# Patient Record
Sex: Male | Born: 1970 | Race: White | Hispanic: No | Marital: Married | Smoking: Former smoker
Health system: Southern US, Community
[De-identification: ages and names within clinical notes are randomized; demographics above are authoritative.]

## PROBLEM LIST (undated history)

## (undated) DIAGNOSIS — I4892 Unspecified atrial flutter: Secondary | ICD-10-CM

## (undated) HISTORY — DX: Unspecified atrial flutter: I48.92

## (undated) HISTORY — PX: ANTERIOR CRUCIATE LIGAMENT REPAIR: SHX115

## (undated) HISTORY — PX: SHOULDER ACROMIOPLASTY: SHX6093

---

## 2002-06-14 ENCOUNTER — Encounter: Payer: Self-pay | Admitting: Emergency Medicine

## 2002-06-14 ENCOUNTER — Emergency Department (HOSPITAL_COMMUNITY): Admission: EM | Admit: 2002-06-14 | Discharge: 2002-06-14 | Payer: Self-pay | Admitting: *Deleted

## 2003-09-05 ENCOUNTER — Emergency Department (HOSPITAL_COMMUNITY): Admission: EM | Admit: 2003-09-05 | Discharge: 2003-09-05 | Payer: Self-pay | Admitting: Physical Therapy

## 2003-09-10 ENCOUNTER — Observation Stay (HOSPITAL_COMMUNITY): Admission: RE | Admit: 2003-09-10 | Discharge: 2003-09-11 | Payer: Self-pay | Admitting: Orthopedic Surgery

## 2016-01-20 ENCOUNTER — Emergency Department (HOSPITAL_COMMUNITY)
Admission: EM | Admit: 2016-01-20 | Discharge: 2016-01-21 | Disposition: A | Discharge status: Home / Self Care | Payer: BC Managed Care – PPO | Attending: Emergency Medicine | Admitting: Emergency Medicine

## 2016-01-20 ENCOUNTER — Emergency Department (HOSPITAL_COMMUNITY): Payer: BC Managed Care – PPO

## 2016-01-20 ENCOUNTER — Encounter (HOSPITAL_COMMUNITY): Payer: Self-pay | Admitting: *Deleted

## 2016-01-20 DIAGNOSIS — I4902 Ventricular flutter: Secondary | ICD-10-CM | POA: Insufficient documentation

## 2016-01-20 DIAGNOSIS — Z87891 Personal history of nicotine dependence: Secondary | ICD-10-CM | POA: Diagnosis not present

## 2016-01-20 DIAGNOSIS — Z720 Tobacco use: Secondary | ICD-10-CM | POA: Diagnosis not present

## 2016-01-20 DIAGNOSIS — I4892 Unspecified atrial flutter: Secondary | ICD-10-CM

## 2016-01-20 DIAGNOSIS — R748 Abnormal levels of other serum enzymes: Secondary | ICD-10-CM | POA: Diagnosis not present

## 2016-01-20 DIAGNOSIS — R000 Tachycardia, unspecified: Secondary | ICD-10-CM | POA: Diagnosis present

## 2016-01-20 LAB — CBC WITH DIFFERENTIAL/PLATELET
BASOS ABS: 0 10*3/uL (ref 0.0–0.1)
BASOS PCT: 0 %
Eosinophils Absolute: 0.2 10*3/uL (ref 0.0–0.7)
Eosinophils Relative: 3 %
HEMATOCRIT: 44.8 % (ref 39.0–52.0)
HEMOGLOBIN: 15.9 g/dL (ref 13.0–17.0)
Lymphocytes Relative: 31 %
Lymphs Abs: 2.3 10*3/uL (ref 0.7–4.0)
MCH: 29.3 pg (ref 26.0–34.0)
MCHC: 35.5 g/dL (ref 30.0–36.0)
MCV: 82.7 fL (ref 78.0–100.0)
MONOS PCT: 6 %
Monocytes Absolute: 0.5 10*3/uL (ref 0.1–1.0)
NEUTROS ABS: 4.4 10*3/uL (ref 1.7–7.7)
NEUTROS PCT: 60 %
Platelets: 170 10*3/uL (ref 150–400)
RBC: 5.42 MIL/uL (ref 4.22–5.81)
RDW: 13.6 % (ref 11.5–15.5)
WBC: 7.4 10*3/uL (ref 4.0–10.5)

## 2016-01-20 LAB — I-STAT TROPONIN, ED: Troponin i, poc: 0.01 ng/mL (ref 0.00–0.08)

## 2016-01-20 LAB — COMPREHENSIVE METABOLIC PANEL
ALBUMIN: 4.2 g/dL (ref 3.5–5.0)
ALK PHOS: 78 U/L (ref 38–126)
ALT: 61 U/L (ref 17–63)
AST: 50 U/L — AB (ref 15–41)
Anion gap: 10 (ref 5–15)
BILIRUBIN TOTAL: 0.6 mg/dL (ref 0.3–1.2)
BUN: 8 mg/dL (ref 6–20)
CALCIUM: 9.6 mg/dL (ref 8.9–10.3)
CO2: 25 mmol/L (ref 22–32)
CREATININE: 1.03 mg/dL (ref 0.61–1.24)
Chloride: 106 mmol/L (ref 101–111)
GFR calc Af Amer: >60 mL/min (ref >60)
GFR calc non Af Amer: >60 mL/min (ref >60)
GLUCOSE: 108 mg/dL — AB (ref 65–99)
Potassium: 4.5 mmol/L (ref 3.5–5.1)
Sodium: 141 mmol/L (ref 135–145)
TOTAL PROTEIN: 7.1 g/dL (ref 6.5–8.1)

## 2016-01-20 LAB — D-DIMER, QUANTITATIVE: D-Dimer, Quant: 0.29 ug/mL-FEU (ref 0.00–0.50)

## 2016-01-20 MED ORDER — SODIUM CHLORIDE 0.9 % IV BOLUS (SEPSIS)
500.0000 mL | Freq: Once | INTRAVENOUS | Status: AC
  Administered 2016-01-20: 500 mL via INTRAVENOUS

## 2016-01-20 MED ORDER — DILTIAZEM HCL 25 MG/5ML IV SOLN
10.0000 mg | Freq: Once | INTRAVENOUS | Status: AC
  Administered 2016-01-20: 10 mg via INTRAVENOUS
  Filled 2016-01-20: qty 5

## 2016-01-20 MED ORDER — DILTIAZEM HCL 25 MG/5ML IV SOLN: 15.0000 mg | Freq: Once | INTRAVENOUS | Status: DC

## 2016-01-20 MED ORDER — DILTIAZEM HCL ER COATED BEADS 120 MG PO CP24
120.0000 mg | ORAL_CAPSULE | Freq: Once | ORAL | Status: AC
  Administered 2016-01-20: 120 mg via ORAL
  Filled 2016-01-20: qty 1

## 2016-01-20 NOTE — ED Notes (Signed)
Pt came home from work and did his home chores outside. Pt came back into the house and felt short of breath when he hit the warm air. Pt stated he ate supper and did not feel well. Pt took his home BP and pulse when he noticed his heart rate and BP were elevated.

## 2016-01-20 NOTE — ED Triage Notes (Signed)
Noticed his heart racing this evening.  Quit smoking 12/08/15

## 2016-01-20 NOTE — ED Notes (Signed)
Dr. Rex Kras advised to hold off on the second dose of Cardizem.

## 2016-01-20 NOTE — ED Provider Notes (Signed)
[facility] DEPT Provider Note   CSN: [id number] Arrival date & time: 01/20/16 1933     History    Chief Complaint  Patient presents with  . Chest Pain  . Tachycardia     HPI Jon Hurst is a 45 y.o. male.  45yo M who p/w heart palpitations. This afternoon, the patient was doing some work around his house and noticed that he was slightly winded. He thought that it was related to his activity. Later this evening, he noticed that his heart began racing. He reports some mild associated shortness of breath but he denies any chest pain. He reports noticing some dyspnea on exertion over the past few weeks but he did quit smoking on 11/15 and thought that it was related to this. He denies any cough/cold symptoms, fevers, or recent illness. No recent travel, leg swelling, or history of blood clots. No nausea or vomiting. No alcohol or drug use. FH notable for mother w/ A fib.  History reviewed. No pertinent past medical history.   There are no active problems to display for this patient.   Past Surgical History:  Procedure Laterality Date  . ANTERIOR CRUCIATE LIGAMENT REPAIR    . SHOULDER ACROMIOPLASTY          Home Medications    Prior to Admission medications   Medication Sig Start Date End Date Taking? Authorizing Provider  carboxymethylcellulose (REFRESH PLUS) 0.5 % SOLN Place 1 drop into both eyes 2 (two) times daily.   Yes Historical Provider, MD  ibuprofen (ADVIL,MOTRIN) 200 MG tablet Take 600 mg by mouth every 8 (eight) hours as needed.   Yes Historical Provider, MD      No family history on file.   Social History  Substance Use Topics  . Smoking status: Former Smoker    Quit date: 12/08/2015  . Smokeless tobacco: Never Used  . Alcohol use Yes     Comment: ocassionally     Allergies     Patient has no known allergies.    Review of Systems  10 Systems reviewed and are negative for acute change except as noted in the HPI.   Physical  Exam Updated Vital Signs BP 112/71   Pulse (!) 42   Temp 98.5 F (36.9 C) (Oral)   Resp 14   Ht 6' (1.829 m)   Wt 220 lb (99.8 kg)   SpO2 99%   BMI 29.84 kg/m   Physical Exam  Constitutional: He is oriented to person, place, and time. He appears well-developed and well-nourished. No distress.  HENT:  Head: Normocephalic and atraumatic.  Moist mucous membranes  Eyes: Conjunctivae are normal. Pupils are equal, round, and reactive to light.  Neck: Neck supple.  Cardiovascular: Regular rhythm and normal heart sounds.  Tachycardia present.   No murmur heard. Pulmonary/Chest: Effort normal and breath sounds normal.  Abdominal: Soft. Bowel sounds are normal. He exhibits no distension. There is no tenderness.  Musculoskeletal: He exhibits no edema.  Neurological: He is alert and oriented to person, place, and time.  Fluent speech  Skin: Skin is warm and dry.  Psychiatric: He has a normal mood and affect. Judgment normal.  Nursing note and vitals reviewed.     ED Treatments / Results  Labs (all labs ordered are listed, but only abnormal results are displayed) Labs Reviewed  COMPREHENSIVE METABOLIC PANEL - Abnormal; Notable for the following:       Result Value   Glucose, Bld 108 (*)    AST 50 (*)  All other components within normal limits  CBC WITH DIFFERENTIAL/PLATELET  D-DIMER, QUANTITATIVE (NOT AT [facility])  Randolm Idol, ED     EKG  EKG Interpretation  Date/Time:  Thursday January 20 2016 19:43:01 EST Ventricular Rate:  153 PR Interval:  130 QRS Duration: 102 QT Interval:  330 QTC Calculation: 526 R Axis:   17 Text Interpretation:  Sinus tachycardia RSR' or QR pattern in V1 suggests right ventricular conduction delay Marked ST abnormality, possible inferior subendocardial injury Marked ST abnormality, possible anterior subendocardial injury Abnormal ECG No previous ECGs available Confirmed by Aiko Belko MD, Aideliz Garmany [phone]) on 01/20/2016 8:36:33 PM          Radiology Dg Chest 2 View  Result Date: 01/20/2016 CLINICAL DATA:  Tachycardia, SOB and dizziness today, ex-smoker (quit 12/08/15) EXAM: CHEST  2 VIEW COMPARISON:  None. FINDINGS: The cardiac silhouette is normal in size and configuration. No mediastinal or hilar masses. No evidence of adenopathy. Clear lungs.  No pleural effusion or pneumothorax. A left clavicle fracture has been reduced with a fixation plate and screws. IMPRESSION: No active cardiopulmonary disease. Electronically Signed   By: Lajean Manes M.D.   On: 01/20/2016 20:41    Procedures  Procedures  Medications Ordered in ED  Medications  diltiazem (CARDIZEM) injection 15 mg (not administered)  diltiazem (CARDIZEM) injection 10 mg (10 mg Intravenous Given 01/20/16 2156)     Initial Impression / Assessment and Plan / ED Course  I have reviewed the triage vital signs and the nursing notes.  Pertinent labs & imaging results that were available during my care of the patient were reviewed by me and considered in my medical decision making (see chart for details).  Clinical Course      PT p/w heart palpitations and mild SOB that began this evening. He was awake and alert, well-appearing on exam. He was tachycardic near 150, normal BP.Gave 10 mg IV diltiazem which slowed his heart rate, revealing atrial flutter waves. He eventually slowed his rate and converted to sinus rhythm after the single dose of diltiazem. He stated that his symptoms have completely resolved once his heart rate slowed. CXR unremarkable and labs including troponin and d-dimer normal. I discussed w/ cardiology, Dr. Jeannine Boga, I appreciate his assistance. Per recs, I have given PO 120mg  dilt and added TSH. He will evaluate pt in ED but has recommended no anticoagulation given CHADSVASC score of 0. He will assist w/ f/u in cardiology clinic. Pt will be discharged w/ diltiazem Rx after completion of cardiology consultation and labwork.   Final Clinical  Impressions(s) / ED Diagnoses   Final diagnoses:  None     New Prescriptions   No medications on file       [name], MD 01/21/16 [phone]

## 2016-01-21 DIAGNOSIS — R748 Abnormal levels of other serum enzymes: Secondary | ICD-10-CM | POA: Diagnosis not present

## 2016-01-21 DIAGNOSIS — Z720 Tobacco use: Secondary | ICD-10-CM | POA: Diagnosis not present

## 2016-01-21 LAB — TSH: TSH: 2.007 u[IU]/mL (ref 0.350–4.500)

## 2016-01-21 MED ORDER — DILTIAZEM HCL ER COATED BEADS 120 MG PO CP24: 120.0000 mg | ORAL_CAPSULE | Freq: Every day | ORAL | 0 refills | Status: DC

## 2016-01-21 NOTE — Consult Note (Signed)
CARDIOLOGY CONSULT NOTE   Referring Physician: Theotis Burrow, MD Primary Physician: Tivis Ringer, MD Primary Cardiologist: None Reason for Consultation: AFl RVR  HPI: Mr. Garriott is a 45 year old male with no past medical history who presents with AFl RVR.  He has no prior past medical history other than a history of a prior clavicular fracture and is healthy at baseline with the exception of his tobacco abuse. Thankfully, he decided to quit 15 days ago with the help of nicotine patches.  For the past 7 days, he has noted short periods of dyspnea on exertion that resolved with a deep breath. Today, while feeding his pets he noted the onset of a prolonged period of dyspnea exertion that culminated in palpitations. These palpitations led him to present to the emergency department, where he was noted to be in atrial flutter with a rapid ventricular rate. He was given Diltiazem 10 mg IV with conversion to normal sinus rhythm. He currently feels well and denies any symptoms including fatigue, shortness of breath, dyspnea on exertion, chest pain, palpitations, lower extremity edema, changes in vision or hearing, changes in sensation or strength.  He has no known history of structural heart disease.  He denies any symptoms of hypothyroidism or thyrotoxicosis.   He denies any history of excessive alcohol use or stimulants.  Review of Systems:     Cardiac Review of Systems: {Y] = yes [ ]  = no  Chest Pain [    ]  Resting SOB [   ] Exertional SOB  [X]   Orthopnea [  ]   Pedal Edema [   ]    Palpitations [X]  Syncope  [  ]   Presyncope [   ]  General Review of Systems: [Y] = yes [  ]=no Constitional: recent weight change [  ]; anorexia [  ]; fatigue [  ]; nausea [  ]; night sweats [  ]; fever [  ]; or chills [  ];                                                                     Eyes : blurred vision [  ]; diplopia [   ]; vision changes [  ];  Amaurosis fugax[  ]; Resp: cough [  ];  wheezing[   ];  hemoptysis[  ];  PND [  ];  GI:  gallstones[  ], vomiting[  ];  dysphagia[  ]; melena[  ];  hematochezia [  ]; heartburn[  ];   GU: kidney stones [  ]; hematuria[  ];   dysuria [  ];  nocturia[  ]; incontinence [  ];             Skin: rash, swelling[  ];, hair loss[  ];  peripheral edema[  ];  or itching[  ]; Musculosketetal: myalgias[  ];  joint swelling[  ];  joint erythema[  ];  joint pain[  ];  back pain[  ];  Heme/Lymph: bruising[  ];  bleeding[  ];  anemia[  ];  Neuro: TIA[  ];  headaches[  ];  stroke[  ];  vertigo[  ];  seizures[  ];   paresthesias[  ];  difficulty walking[  ];  Psych:depression[  ]; anxiety[  ];  Endocrine: diabetes[  ];  thyroid dysfunction[  ];  Other:  History reviewed. No pertinent past medical history.  Prior to Admission medications   Medication Sig Start Date End Date Taking? Authorizing Provider  carboxymethylcellulose (REFRESH PLUS) 0.5 % SOLN Place 1 drop into both eyes 2 (two) times daily.   Yes Historical Provider, MD  ibuprofen (ADVIL,MOTRIN) 200 MG tablet Take 600 mg by mouth every 8 (eight) hours as needed.   Yes Historical Provider, MD  diltiazem (CARDIZEM CD) 120 MG 24 hr capsule Take 1 capsule (120 mg total) by mouth daily. 01/21/16   [name], MD   No Known Allergies  Social History   Social History  . Marital status: Married    Spouse name: N/A  . Number of children: N/A  . Years of education: N/A   Occupational History  . Not on file.   Social History Main Topics  . Smoking status: Former Smoker    Quit date: 12/08/2015  . Smokeless tobacco: Never Used  . Alcohol use Yes     Comment: ocassionally  . Drug use: No  . Sexual activity: Not on file   Other Topics Concern  . Not on file   Social History Narrative  . No narrative on file    No family history on file.  PHYSICAL EXAM: Vitals:   01/21/16 0030 01/21/16 0045  BP: 115/79 113/77  Pulse: 74 71  Resp: 13 20  Temp:      Intake/Output Summary  (Last 24 hours) at 01/21/16 0127 Last data filed at 01/20/16 2343  Gross per 24 hour  Intake              500 ml  Output                0 ml  Net              500 ml    General:  Well appearing. No respiratory difficulty HEENT: normal Neck: supple. no JVD. Carotids 2+ bilat; no bruits. No lymphadenopathy or thryomegaly appreciated. Cor: PMI nondisplaced. Regular rate & rhythm. No rubs or murmurs. Prominent S4 gallop. Lungs: clear Abdomen: soft, nontender, nondistended. No hepatosplenomegaly. No bruits or masses. Good bowel sounds. Extremities: no cyanosis, clubbing, rash, edema Neuro: alert & oriented x 3, cranial nerves grossly intact. moves all 4 extremities w/o difficulty. Affect pleasant.  ECG: AFl RVR AFl with 3:1 block NSR, non-specific TWI  Results for orders placed or performed during the hospital encounter of 01/20/16 (from the past 24 hour(s))  I-Stat Troponin, ED (not at [facility])     Status: None   Collection Time: 01/20/16  8:07 PM  Result Value Ref Range   Troponin i, poc 0.01 0.00 - 0.08 ng/mL   Comment 3          CBC with Differential     Status: None   Collection Time: 01/20/16  8:15 PM  Result Value Ref Range   WBC 7.4 4.0 - 10.5 K/uL   RBC 5.42 4.22 - 5.81 MIL/uL   Hemoglobin 15.9 13.0 - 17.0 g/dL   HCT 44.8 39.0 - 52.0 %   MCV 82.7 78.0 - 100.0 fL   MCH 29.3 26.0 - 34.0 pg   MCHC 35.5 30.0 - 36.0 g/dL   RDW 13.6 11.5 - 15.5 %   Platelets 170 150 - 400 K/uL   Neutrophils Relative % 60 %   Neutro Abs 4.4 1.7 - 7.7 K/uL   Lymphocytes Relative 31 %  Lymphs Abs 2.3 0.7 - 4.0 K/uL   Monocytes Relative 6 %   Monocytes Absolute 0.5 0.1 - 1.0 K/uL   Eosinophils Relative 3 %   Eosinophils Absolute 0.2 0.0 - 0.7 K/uL   Basophils Relative 0 %   Basophils Absolute 0.0 0.0 - 0.1 K/uL  Comprehensive metabolic panel     Status: Abnormal   Collection Time: 01/20/16  8:15 PM  Result Value Ref Range   Sodium 141 135 - 145 mmol/L   Potassium 4.5 3.5 - 5.1 mmol/L    Chloride 106 101 - 111 mmol/L   CO2 25 22 - 32 mmol/L   Glucose, Bld 108 (H) 65 - 99 mg/dL   BUN 8 6 - 20 mg/dL   Creatinine, Ser 1.03 0.61 - 1.24 mg/dL   Calcium 9.6 8.9 - 10.3 mg/dL   Total Protein 7.1 6.5 - 8.1 g/dL   Albumin 4.2 3.5 - 5.0 g/dL   AST 50 (H) 15 - 41 U/L   ALT 61 17 - 63 U/L   Alkaline Phosphatase 78 38 - 126 U/L   Total Bilirubin 0.6 0.3 - 1.2 mg/dL   GFR calc non Af Amer >60 >60 mL/min   GFR calc Af Amer >60 >60 mL/min   Anion gap 10 5 - 15  D-dimer, quantitative     Status: None   Collection Time: 01/20/16  9:36 PM  Result Value Ref Range   D-Dimer, Quant 0.29 0.00 - 0.50 ug/mL-FEU  TSH     Status: None   Collection Time: 01/21/16 12:11 AM  Result Value Ref Range   TSH 2.007 0.350 - 4.500 uIU/mL   Dg Chest 2 View  Result Date: 01/20/2016 CLINICAL DATA:  Tachycardia, SOB and dizziness today, ex-smoker (quit 12/08/15) EXAM: CHEST  2 VIEW COMPARISON:  None. FINDINGS: The cardiac silhouette is normal in size and configuration. No mediastinal or hilar masses. No evidence of adenopathy. Clear lungs.  No pleural effusion or pneumothorax. A left clavicle fracture has been reduced with a fixation plate and screws. IMPRESSION: No active cardiopulmonary disease. Electronically Signed   By: Lajean Manes M.D.   On: 01/20/2016 20:41   ASSESSMENT: Mr. Gurkin is a 45 year old male with no past medical history who presents with AFl RVR. He is current in NSR, asymptomatic, and safe for discharge.  PLAN/DISCUSSION: #) Cv(r) - typical anticlockwise Afl; CHADSVasc of 0.  Dx: - CBC, Chem 7, TSH - EKG - TTE as outpatient - Refer to Afib Clinic Tx: - Diltiazem CD 120 mg daily - No need for [facility] given CHADSVasc of 0  #) h/o tobacco abuse - Continue to abstain from smoking  Allyson Sabal, MD Cardiology

## 2016-02-11 ENCOUNTER — Ambulatory Visit: Payer: BC Managed Care – PPO | Admitting: Cardiology

## 2016-02-16 ENCOUNTER — Ambulatory Visit: Payer: BC Managed Care – PPO | Admitting: Cardiovascular Disease

## 2016-02-29 ENCOUNTER — Encounter: Payer: Self-pay | Admitting: Cardiovascular Disease

## 2016-02-29 ENCOUNTER — Ambulatory Visit (INDEPENDENT_AMBULATORY_CARE_PROVIDER_SITE_OTHER): Payer: BC Managed Care – PPO | Admitting: Cardiovascular Disease

## 2016-02-29 VITALS — BP 132/80 | HR 60 | Ht 72.0 in | Wt 229.0 lb

## 2016-02-29 DIAGNOSIS — R0602 Shortness of breath: Secondary | ICD-10-CM

## 2016-02-29 DIAGNOSIS — E669 Obesity, unspecified: Secondary | ICD-10-CM

## 2016-02-29 DIAGNOSIS — I483 Typical atrial flutter: Secondary | ICD-10-CM | POA: Diagnosis not present

## 2016-02-29 DIAGNOSIS — E7800 Pure hypercholesterolemia, unspecified: Secondary | ICD-10-CM

## 2016-02-29 DIAGNOSIS — R9431 Abnormal electrocardiogram [ECG] [EKG]: Secondary | ICD-10-CM | POA: Diagnosis not present

## 2016-02-29 DIAGNOSIS — R7303 Prediabetes: Secondary | ICD-10-CM

## 2016-02-29 NOTE — Progress Notes (Signed)
Cardiology Consultation Note    Date:  02/29/2016   ID:  Jon Hurst, DOB 1970/09/16, MRN [id number]  PCP:  Tivis Ringer, MD  Cardiologist:   Sanda Klein, MD   Reason for consultation    Referred Dr. Rex Kras for Tachycardia and CP  pt states had CP for one day (Dec 28)--HR was 154, was in Atrial flutter, was put on diltiazem--would like to take something else--pt c/o SOB and dizziness--random    History of Present Illness:  Jon Hurst is a 46 y.o. male with newly diagnosed atrial flutter. This occurred on the evening of December 28 and resolve spontaneously during his evaluation in the emergency room.  Jon Hurst is an otherwise healthy gentleman without past history of structural cardiac or other vascular disease, hypertension, diabetes, hyperlipidemia, stroke/TIA or serious chronic medical illness. He smoked roughly a pack of cigarettes weekly for about 20 years and quit in November 2017. In mid December he noticed that he had reduced exercise tolerance and was frequently short of breath. On the evening of the 28th he was particular short of breath when walking outside in the very cold air. He has a smart watch that showed that his heart rate was regular until late that evening when suddenly his heart rate increased to 150 bpm. He simultaneously felt palpitations, which he had not felt earlier during the day. There is a report that he had some chest discomfort, but he does not acknowledge that to me today.  On arrival to the emergency room he was found to be in atrial flutter with 2:1 AV block and a heart rate of 153 bpm. Treatment with intravenous diltiazem led to gradual slowing of the ventricular rate although atrial flutter persisted. He spontaneously converted to normal rhythm around 11 PM and subsequently went home.  He was discharged on sustained release diltiazem 120 mg but ever since taking this medication he has been complaining of fatigue and lack of energy.  Interestingly shortness of breath that preceded his arrhythmia has resolved completely. He never complained of chest tightness/fullness or any other anginal type complaints. He did not have dizziness or presyncope/syncope. He denies any focal neurological problems. Is not had any bleeding complications. He has occasional mild ankle edema.  He is a moderately loud snorer. He does have some symptoms of daytime hypersomnolence and often falls asleep when watching a movie with his wife. 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 She reports gaining substantial weight after he quit smoking.  After converting to sinus rhythm his electrocardiogram showed an isolated inverted T-wave in lead V3 and in lead 3. His electrocardiogram today shows more extensive changes with mild ST segment depression and T-wave inversion in leads V1-V4 as well as lead 3. The QTC is normal at 406 ms. He is in normal sinus rhythm.    No past medical history on file.  Past Surgical History:  Procedure Laterality Date  . ANTERIOR CRUCIATE LIGAMENT REPAIR    . SHOULDER ACROMIOPLASTY      Current Medications: Outpatient Medications Prior to Visit  Medication Sig Dispense Refill  . carboxymethylcellulose (REFRESH PLUS) 0.5 % SOLN Place 1 drop into both eyes 2 (two) times daily.    Marland Kitchen diltiazem (CARDIZEM CD) 120 MG 24 hr capsule Take 1 capsule (120 mg total) by mouth daily. 30 capsule 0  . ibuprofen (ADVIL,MOTRIN) 200 MG tablet Take 600 mg by mouth every  8 (eight) hours as needed.     No facility-administered medications prior to visit.      Allergies:   Patient has no known allergies.   Social History   Social History  . Marital status: Married    Spouse name: N/A  . Number of children: N/A  . Years of education: N/A   Social History Main Topics  . Smoking status: Former Smoker    Quit date: 12/08/2015  . Smokeless tobacco: Never Used  . Alcohol use Yes      Comment: ocassionally  . Drug use: No  . Sexual activity: Not Asked   Other Topics Concern  . None   Social History Narrative  . None     Family History:  [name] Family history significant for the absence of first-degree relatives with cardiovascular disease.  ROS:   Please see the history of present illness.    ROS All other systems reviewed and are negative.   PHYSICAL EXAM:   VS:  BP 132/80 (BP Location: Left Arm, Patient Position: Sitting, Cuff Size: Normal)   Pulse 60   Ht 6' (1.829 m)   Wt 103.9 kg (229 lb)   BMI 31.06 kg/m    GEN: Well nourished, well developed, in no acute distress  HEENT: normal  Neck: no JVD, carotid bruits, or masses Cardiac: He has a very distinct fairly loud second heart sound that is physiologically split; RRR; no murmurs, rubs, or gallops,no edema  Respiratory:  clear to auscultation bilaterally, normal work of breathing GI: soft, nontender, nondistended, + BS. Prominent ventral obesity. MS: no deformity or atrophy  Skin: warm and dry, no rash Neuro:  Alert and Oriented x 3, Strength and sensation are intact Psych: euthymic mood, full affect  Wt Readings from Last 3 Encounters:  02/29/16 103.9 kg (229 lb)  01/20/16 99.8 kg (220 lb)      Studies/Labs Reviewed:   EKG:  EKG is ordered today.  The ekg ordered today demonstrates Normal sinus rhythm, mild ST segment depression and T-wave inversion in leads V1-V4 and in lead 3  Recent Labs: 01/20/2016: ALT 61; BUN 8; Creatinine, Ser 1.03; Hemoglobin 15.9; Platelets 170; Potassium 4.5; Sodium 141 01/21/2016: TSH 2.007   Lipid Panel 07/13/2015: Total cholesterol 200, LDL 129, HDL 44, triglycerides 137, A1c not checked, glucose 108, TSH 2.00, creatinine 1.03  Additional studies/ records that were reviewed today include:  Records from emergency room visit December 28  ASSESSMENT:    1. Typical atrial flutter ([place])   2. Abnormal ECG   3. Hypercholesterolemia   4. Mild  obesity   5. Prediabetes   6. Shortness of breath      PLAN:  In order of problems listed above:  1. Atrial flutter: He has only had the single event. Diltiazem seems to be causing more side effects than benefit. Will discontinue it. He may not have another event for a while. He to check for underlying possible causes such as obstructive sleep apnea, emphysema/COPD, venous thromboembolic disease. None of these are immediately obvious, although he does have some symptoms of possible sleep apnea. We'll start with an echocardiogram. If there is evidence of right heart chamber enlargement or pulmonary hypertension will proceed with pulmonary function tests, sleep study, CT angiogram of the chest. It's important to note that his symptoms of exertional dyspnea preceded the arrhythmia, maybe a few days earlier. He did not have angina. His cardioembolic risk is low. At this point he does not have any risk  factors for stroke due to cardiac embolism and he will take aspirin 81 mg daily. I see no indication for full anticoagulant at this time. He asked about the long-term likelihood of arrhythmia recurrence. We discussed the fact that this depends on the presence or absence of structural cardiac changes or other comorbid conditions. We did review options for radiofrequency ablation after his workup is complete, if the arrhythmia burden justifies it. 2. Abnormal ECG: The repolarization abnormalities on his electrocardiogram raises the concern for coronary artery disease. Unfortunately there are no old tracings for comparison. We'll schedule for [employer]. Simple treadmill stress test will not suffice it due to the baseline ST segment changes. 3. HLP: If he does not have coronary/vascular disease is currently lipid profile, while not ideal, is acceptable and he does not meet criteria for pharmacological therapy. If we do identify coronary disease aggressive lipid lowering to a target LDL of 70 is  indicated. 4. Obesity: He is mildly obese and has evidence of prediabetes. Weight loss is strongly recommended. Daily mild-moderate exercise can be started now. Discussed calorie restriction and a healthy diet.    Medication Adjustments/Labs and Tests Ordered: Current medicines are reviewed at length with the patient today.  Concerns regarding medicines are outlined above.  Medication changes, Labs and Tests ordered today are listed in the Patient Instructions below. Patient Instructions  Medication Instructions: Dr Sallyanne Kuster recommends that you continue on your current medications as directed. Please refer to the Current Medication list given to you today.  Labwork: NONE ORDERED  Testing/Procedures: 1. Echocardiogram - Your physician has requested that you have an echocardiogram. Echocardiography is a painless test that uses sound waves to create images of your heart. It provides your doctor with information about the size and shape of your heart and how well your heart's chambers and valves are working. This procedure takes approximately one hour. There are no restrictions for this procedure. This will be performed at our [facility] location - [address] [place] has requested that you have en exercise stress myoview. For further information please visit [email or web address]. Please follow instruction sheet, as given.  Follow-up: Dr Sallyanne Kuster recommends that you schedule a follow-up appointment after your tests are completed.   If you need a refill on your cardiac medications before your next appointment, please call your pharmacy.    Signed, Sanda Klein, MD  02/29/2016 6:15 PM    [facility] [address], [place], [place]  [address] Phone: [phone]; Fax: [phone]

## 2016-02-29 NOTE — Patient Instructions (Signed)
Medication Instructions: Dr Sallyanne Kuster recommends that you continue on your current medications as directed. Please refer to the Current Medication list given to you today.  Labwork: NONE ORDERED  Testing/Procedures: 1. Echocardiogram - Your physician has requested that you have an echocardiogram. Echocardiography is a painless test that uses sound waves to create images of your heart. It provides your doctor with information about the size and shape of your heart and how well your heart's chambers and valves are working. This procedure takes approximately one hour. There are no restrictions for this procedure. This will be performed at our [facility] location - [address], [place] has requested that you have en exercise stress myoview. For further information please visit [email or web address]. Please follow instruction sheet, as given.  Follow-up: Dr Sallyanne Kuster recommends that you schedule a follow-up appointment after your tests are completed.   If you need a refill on your cardiac medications before your next appointment, please call your pharmacy.

## 2016-03-01 ENCOUNTER — Telehealth (HOSPITAL_COMMUNITY): Payer: Self-pay

## 2016-03-01 NOTE — Telephone Encounter (Signed)
Encounter complete. 

## 2016-03-02 ENCOUNTER — Other Ambulatory Visit: Payer: Self-pay

## 2016-03-02 ENCOUNTER — Ambulatory Visit (HOSPITAL_BASED_OUTPATIENT_CLINIC_OR_DEPARTMENT_OTHER): Payer: BC Managed Care – PPO

## 2016-03-02 ENCOUNTER — Ambulatory Visit (HOSPITAL_COMMUNITY)
Admission: RE | Admit: 2016-03-02 | Discharge: 2016-03-02 | Disposition: A | Discharge status: Home / Self Care | Payer: BC Managed Care – PPO | Source: Ambulatory Visit | Attending: Cardiology | Admitting: Cardiology

## 2016-03-02 DIAGNOSIS — I483 Typical atrial flutter: Secondary | ICD-10-CM

## 2016-03-02 DIAGNOSIS — R0602 Shortness of breath: Secondary | ICD-10-CM | POA: Diagnosis present

## 2016-03-02 DIAGNOSIS — I501 Left ventricular failure: Secondary | ICD-10-CM | POA: Insufficient documentation

## 2016-03-02 DIAGNOSIS — I253 Aneurysm of heart: Secondary | ICD-10-CM | POA: Diagnosis not present

## 2016-03-02 LAB — ECHOCARDIOGRAM COMPLETE
AOASC: 27 cm
E decel time: 215 msec
EERAT: 6.43
FS: 35 % (ref 28–44)
Height: 72 in
IV/PV OW: 0.73
LA ID, A-P, ES: 39 mm
LA diam end sys: 39 mm
LA diam index: 1.73 cm/m2
LA vol index: 22.6 mL/m2
LAVOL: 51 mL
LAVOLA4C: 47 mL
LDCA: 4.52 cm2
LV TDI E'MEDIAL: 8.01
LVEEAVG: 6.43
LVEEMED: 6.43
LVELAT: 9.43 cm/s
LVOT VTI: 15.7 cm
LVOT diameter: 24 mm
LVOT peak grad rest: 3 mmHg
LVOT peak vel: 81.1 cm/s
LVOTSV: 71 mL
MV Dec: 215
MVPKAVEL: 56.7 m/s
MVPKEVEL: 60.6 m/s
PW: 13.8 mm — AB (ref 0.6–1.1)
Reg peak vel: 201 cm/s
TDI e' lateral: 9.43
TR max vel: 201 cm/s
Weight: 3664 oz

## 2016-03-02 LAB — MYOCARDIAL PERFUSION IMAGING
CHL CUP MPHR: 173 {beats}/min
CHL CUP NUCLEAR SDS: 0
CHL CUP RESTING HR STRESS: 62 {beats}/min
CHL RATE OF PERCEIVED EXERTION: 17
CSEPED: 11 min
CSEPEDS: 30 s
CSEPHR: 97 %
Estimated workload: 13.2 METS
LV sys vol: 34 mL
LVDIAVOL: 90 mL (ref 62–150)
Peak HR: 169 {beats}/min
SRS: 1
SSS: 1
TID: 0.88

## 2016-03-02 MED ORDER — TECHNETIUM TC 99M TETROFOSMIN IV KIT
31.8000 | PACK | Freq: Once | INTRAVENOUS | Status: DC | PRN
  Filled 2016-03-02: qty 32

## 2016-03-02 MED ORDER — TECHNETIUM TC 99M TETROFOSMIN IV KIT
10.4000 | PACK | Freq: Once | INTRAVENOUS | Status: DC | PRN
  Filled 2016-03-02: qty 11

## 2016-03-09 ENCOUNTER — Telehealth: Payer: Self-pay | Admitting: Cardiovascular Disease

## 2016-03-09 NOTE — Telephone Encounter (Signed)
Returned call patient. See result note from patient's echocardiogram for details.  TEE is not urgent and it is okay to wait until patient's scheduled follow-up (03/21/16) for scheduling this procedure.

## 2016-03-09 NOTE — Telephone Encounter (Signed)
New Message     Returning [place] call with test results, does he need to have follow up test

## 2016-03-09 NOTE — Telephone Encounter (Signed)
Will forward to [place]

## 2016-03-21 ENCOUNTER — Ambulatory Visit (INDEPENDENT_AMBULATORY_CARE_PROVIDER_SITE_OTHER): Payer: BC Managed Care – PPO | Admitting: Cardiovascular Disease

## 2016-03-21 ENCOUNTER — Encounter: Payer: Self-pay | Admitting: Cardiovascular Disease

## 2016-03-21 VITALS — BP 120/78 | HR 69 | Ht 72.0 in | Wt 225.0 lb

## 2016-03-21 DIAGNOSIS — R9431 Abnormal electrocardiogram [ECG] [EKG]: Secondary | ICD-10-CM | POA: Diagnosis not present

## 2016-03-21 DIAGNOSIS — E7800 Pure hypercholesterolemia, unspecified: Secondary | ICD-10-CM | POA: Diagnosis not present

## 2016-03-21 DIAGNOSIS — I483 Typical atrial flutter: Secondary | ICD-10-CM | POA: Diagnosis not present

## 2016-03-21 DIAGNOSIS — E669 Obesity, unspecified: Secondary | ICD-10-CM

## 2016-03-21 NOTE — Progress Notes (Signed)
Cardiology Consultation Note    Date:  03/22/2016   ID:  Jon Hurst, DOB 01-22-1971, MRN [id number]  PCP:  Tivis Ringer, MD  Cardiologist:   Sanda Klein, MD   Reason for consultation    Referred Dr. Rex Kras for Tachycardia and CP  pt states had CP for one day (Dec 28)--HR was 154, was in Atrial flutter, was put on diltiazem--would like to take something else--pt c/o SOB and dizziness--random    History of Present Illness:  Jon Hurst is a 46 y.o. male with newly diagnosed atrial flutter. This occurred on the evening of December 28 and resolve spontaneously during his evaluation in the emergency room.  His workup has been essentially unrevealing. He has normal right and left heart chamber sizes with normal right and left ventricular systolic function and without wall hypertrophy or valvular abnormalities. 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  He had a good performance on the treadmill stress test (11.5 minutes on the Bruce protocol) with a hypertensive response. Despite the fact that he had subtle ST segment depression in several leads had normal perfusion and normal LVEF 62%  Jon Hurst is an otherwise healthy gentleman without past history of structural cardiac or other vascular disease, hypertension, diabetes, hyperlipidemia, stroke/TIA or serious chronic medical illness. He smoked roughly a pack of cigarettes weekly for about 20 years and quit in November 2017. In mid December he noticed that he had reduced exercise tolerance and was frequently short of breath. On the evening of the 28th he was particular short of breath when walking outside in the very cold air. He has a smart watch that showed that his heart rate was regular until late that evening when suddenly his heart rate increased to 150 bpm. He simultaneously felt palpitations, which he had not felt earlier during  the day. There is a report that he had some chest discomfort, but he does not acknowledge that to me today.  On arrival to the emergency room he was found to be in atrial flutter with 2:1 AV block and a heart rate of 153 bpm. Treatment with intravenous diltiazem led to gradual slowing of the ventricular rate although atrial flutter persisted. He spontaneously converted to normal rhythm around 11 PM and subsequently went home.  He did not tolerate treatment with diltiazem due to fatigue. He is not currently taking any medications for rate/rhythm control.  The shortness of breath that preceded his arrhythmia has resolved completely. He never complained of chest tightness/fullness or any other anginal type complaints. He did not have dizziness or presyncope/syncope. He denies any focal neurological problems. Is not had any bleeding complications. He has occasional mild ankle edema.  He is a moderately loud snorer and has gained weight after he stops smoking, but does not have typical symptoms of obstructive sleep apnea..  No past medical history except as described above .  Past Surgical History:  Procedure Laterality Date  . ANTERIOR CRUCIATE LIGAMENT REPAIR    . SHOULDER ACROMIOPLASTY      Current Medications: Outpatient Medications Prior to Visit  Medication Sig Dispense Refill  . carboxymethylcellulose (REFRESH PLUS) 0.5 % SOLN Place 1 drop into both eyes as needed.     Marland Kitchen ibuprofen (ADVIL,MOTRIN) 200 MG tablet Take 600 mg by mouth every 8 (eight) hours as needed.    . diltiazem (CARDIZEM CD) 120 MG 24 hr capsule Take 1 capsule (120 mg  total) by mouth daily. (Patient not taking: Reported on 03/21/2016) 30 capsule 0   No facility-administered medications prior to visit.      Allergies:   Patient has no known allergies.   Social History   Social History  . Marital status: Married    Spouse name: N/A  . Number of children: N/A  . Years of education: N/A   Social History Main Topics    . Smoking status: Former Smoker    Quit date: 12/08/2015  . Smokeless tobacco: Never Used  . Alcohol use Yes     Comment: ocassionally  . Drug use: No  . Sexual activity: Not Asked   Other Topics Concern  . None   Social History Narrative  . None     Family History:  [name] Family history significant for the absence of first-degree relatives with cardiovascular disease.  ROS:   Please see the history of present illness.    ROS All other systems reviewed and are negative.   PHYSICAL EXAM:   VS:  BP 120/78 (BP Location: Right Arm, Patient Position: Sitting, Cuff Size: Normal)   Pulse 69   Ht 6' (1.829 m)   Wt 102.1 kg (225 lb)   SpO2 97%   BMI 30.52 kg/m    GEN: Well nourished, well developed, in no acute distress  HEENT: normal  Neck: no JVD, carotid bruits, or masses Cardiac: He has a very distinct fairly loud second heart sound that is physiologically split; RRR; no murmurs, rubs, or gallops,no edema  Respiratory:  clear to auscultation bilaterally, normal work of breathing GI: soft, nontender, nondistended, + BS. Prominent ventral obesity. MS: no deformity or atrophy  Skin: warm and dry, no rash Neuro:  Alert and Oriented x 3, Strength and sensation are intact Psych: euthymic mood, full affect  Wt Readings from Last 3 Encounters:  03/21/16 102.1 kg (225 lb)  03/02/16 103.9 kg (229 lb)  02/29/16 103.9 kg (229 lb)      Studies/Labs Reviewed:   EKG:  EKG is ordered today.  The ekg ordered today demonstrates Normal sinus rhythm, mild ST segment depression and T-wave inversion in leads V1-V4 and in lead 3  Recent Labs: 01/20/2016: ALT 61; BUN 8; Creatinine, Ser 1.03; Hemoglobin 15.9; Platelets 170; Potassium 4.5; Sodium 141 01/21/2016: TSH 2.007   Lipid Panel 07/13/2015: Total cholesterol 200, LDL 129, HDL 44, triglycerides 137, A1c not checked, glucose 108, TSH 2.00, creatinine 1.03  Additional studies/ records that were reviewed today include:   Records from emergency room visit December 28  ASSESSMENT:    1. Hypercholesterolemia   2. Typical atrial flutter ([place])   3. Abnormal ECG   4. Mild obesity      PLAN:  In order of problems listed above:  1. Atrial flutter: He has only had the single event. His cardiac workup has not shown any high risk findings. The only subtle detected abnormality was the presence of an atrial septal aneurysm which likely does not have a direct relationship to his arrhythmia. I offered TEE to make sure that he does not have a small ASD, but his echo really does not show evidence of right heart overload/shunt. He wants to think the TEE over for a while. His cardioembolic risk is low. At this point he does not have any risk factors for stroke and he will take aspirin 81 mg daily. We did review options for radiofrequency ablation after his workup is complete, if the arrhythmia burden justifies it. 2.  Abnormal ECG: Exercise for >11 minutes without angina on the Bruce protocol, normal perfusion pattern. No reason to suspect CAD 3. HLP:  Since he does not have known coronary/vascular disease, his current lipid profile, while not ideal, is acceptable. Recommend weight loss, increase physical activity, recheck lipids in 6-12 months. 4. Obesity: He is mildly obese and has evidence of prediabetes. Weight loss is strongly recommended. Daily mild-moderate exercise can be started now. Discussed calorie restriction and a healthy diet.    Medication Adjustments/Labs and Tests Ordered: Current medicines are reviewed at length with the patient today.  Concerns regarding medicines are outlined above.  Medication changes, Labs and Tests ordered today are listed in the Patient Instructions below. Patient Instructions  Dr Sallyanne Kuster recommends that you schedule a follow-up appointment in 12 months. You will receive a reminder letter in the mail two months in advance. If you don't receive a letter, please call our office to  schedule the follow-up appointment.  If you need a refill on your cardiac medications before your next appointment, please call your pharmacy.    Signed, Sanda Klein, MD  03/22/2016 5:55 PM    [address], [place], [place]  [address] Phone: [phone]; Fax: [phone]

## 2016-03-21 NOTE — Patient Instructions (Signed)
Dr Croitoru recommends that you schedule a follow-up appointment in 12 months. You will receive a reminder letter in the mail two months in advance. If you don't receive a letter, please call our office to schedule the follow-up appointment.  If you need a refill on your cardiac medications before your next appointment, please call your pharmacy. 

## 2016-03-22 DIAGNOSIS — E7800 Pure hypercholesterolemia, unspecified: Secondary | ICD-10-CM | POA: Insufficient documentation

## 2016-03-22 DIAGNOSIS — R9431 Abnormal electrocardiogram [ECG] [EKG]: Secondary | ICD-10-CM | POA: Insufficient documentation

## 2016-03-22 DIAGNOSIS — I4892 Unspecified atrial flutter: Secondary | ICD-10-CM | POA: Insufficient documentation

## 2016-03-22 DIAGNOSIS — E669 Obesity, unspecified: Secondary | ICD-10-CM | POA: Insufficient documentation

## 2016-09-29 ENCOUNTER — Encounter: Payer: Self-pay | Admitting: Cardiovascular Disease

## 2016-09-29 ENCOUNTER — Ambulatory Visit (INDEPENDENT_AMBULATORY_CARE_PROVIDER_SITE_OTHER): Payer: BC Managed Care – PPO | Admitting: Cardiovascular Disease

## 2016-09-29 VITALS — BP 110/72 | HR 60 | Ht 72.0 in | Wt 225.0 lb

## 2016-09-29 DIAGNOSIS — E7800 Pure hypercholesterolemia, unspecified: Secondary | ICD-10-CM | POA: Diagnosis not present

## 2016-09-29 DIAGNOSIS — Q211 Atrial septal defect, unspecified: Secondary | ICD-10-CM

## 2016-09-29 DIAGNOSIS — E669 Obesity, unspecified: Secondary | ICD-10-CM | POA: Diagnosis not present

## 2016-09-29 DIAGNOSIS — I483 Typical atrial flutter: Secondary | ICD-10-CM | POA: Diagnosis not present

## 2016-09-29 DIAGNOSIS — R9431 Abnormal electrocardiogram [ECG] [EKG]: Secondary | ICD-10-CM | POA: Diagnosis not present

## 2016-09-29 NOTE — Progress Notes (Signed)
Cardiology Office Note    Date:  10/01/2016   ID:  Jon Hurst, DOB [birth year], MRN [id number]  PCP:  Prince Solian, MD  Cardiologist:   Sanda Klein, MD   Chief complaint     Occasional dyspnea at rest     History of Present Illness:  Jon Hurst is a 46 y.o. male presented with a single episode of atrial flutter in December 2018, that resolved spontaneously.  He has had some random episodes of shortness of breath and dizziness but have not been associated with physical activity. He is unaware of any palpitations and was not directly aware of the arrhythmia last December (resenting symptom was dyspnea).  He has normal right and left heart chamber sizes with normal right and left ventricular systolic function and without wall hypertrophy or valvular abnormalities. 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 He had a good performance on the treadmill stress test (11.5 minutes on the Bruce protocol) with a hypertensive response. Despite the fact that he had subtle ST segment depression in several leads had normal perfusion and normal LVEF 62%. He has never had a stroke or TIA and does not have chronic risk factors such as hypertension, diabetes, hyperlipidemia, known vascular disease, sleep apnea or COPD. He smoked roughly a pack of cigarettes weekly for about 20 years and quit in November 2017. He did not tolerate treatment with diltiazem due to fatigue. He gained weight after he quit smoking and is borderline obese.  Past Surgical History:  Procedure Laterality Date  . ANTERIOR CRUCIATE LIGAMENT REPAIR    . SHOULDER ACROMIOPLASTY      Current Medications: Outpatient Medications Prior to Visit  Medication Sig Dispense Refill  . carboxymethylcellulose (REFRESH PLUS) 0.5 % SOLN Place 1 drop into both eyes as needed.     Marland Kitchen ibuprofen (ADVIL,MOTRIN) 200 MG tablet Take 600 mg by mouth  every 8 (eight) hours as needed.     No facility-administered medications prior to visit.      Allergies:   Patient has no known allergies.   Social History   Social History  . Marital status: Married    Spouse name: N/A  . Number of children: N/A  . Years of education: N/A   Social History Main Topics  . Smoking status: Former Smoker    Quit date: 12/08/2015  . Smokeless tobacco: Never Used  . Alcohol use Yes     Comment: ocassionally  . Drug use: No  . Sexual activity: Not Asked   Other Topics Concern  . None   Social History Narrative  . None     Family History:  [name] Family history significant for the absence of first-degree relatives with cardiovascular disease.  ROS:   Please see the history of present illness.    ROS All other systems reviewed and are negative.   PHYSICAL EXAM:   VS:  BP 110/72   Pulse 60   Ht 6' (1.829 m)   Wt 225 lb (102.1 kg)   BMI 30.52 kg/m    GEN: Borderline obese, well developed, in no acute distress   General: Alert, oriented x3, no distress Head: no evidence of trauma, PERRL, EOMI, no exophtalmos or lid lag, no myxedema, no xanthelasma; normal ears, nose and oropharynx Neck: normal jugular venous pulsations and no hepatojugular reflux; brisk carotid pulses without delay and no carotid bruits Chest: clear to auscultation, no signs of  consolidation by percussion or palpation, normal fremitus, symmetrical and full respiratory excursions Cardiovascular: normal position and quality of the apical impulse, regular rhythm, normal first and second heart sounds, no murmurs, rubs or gallops Abdomen: no tenderness or distention, no masses by palpation, no abnormal pulsatility or arterial bruits, normal bowel sounds, no hepatosplenomegaly Extremities: no clubbing, cyanosis or edema; 2+ radial, ulnar and brachial pulses bilaterally; 2+ right femoral, posterior tibial and dorsalis pedis pulses; 2+ left femoral, posterior tibial and  dorsalis pedis pulses; no subclavian or femoral bruits Neurological: grossly nonfocal Psych: euthymic mood, full affect  Wt Readings from Last 3 Encounters:  09/29/16 225 lb (102.1 kg)  03/21/16 225 lb (102.1 kg)  03/02/16 229 lb (103.9 kg)      Studies/Labs Reviewed:   EKG:  EKG is ordered today.  I will sinus rhythm, incomplete right bundle branch block, T-wave inversion V1-V4  Recent Labs: 01/20/2016: ALT 61; BUN 8; Creatinine, Ser 1.03; Hemoglobin 15.9; Platelets 170; Potassium 4.5; Sodium 141 01/21/2016: TSH 2.007   Lipid Panel 07/13/2015: Total cholesterol 200, LDL 129, HDL 44, triglycerides 137, A1c not checked, glucose 108, TSH 2.00, creatinine 1.03 08/15/2016: Total cholesterol 211, triglycerides 135, LDL 134, HDL 50   ASSESSMENT:    1. ASD (atrial septal defect)      PLAN:  In order of problems listed above:  1. Atrial flutter: Today he has only had one documented and. He continues have occasional episodes of unexplained dyspnea. Discussed event monitor versus smart phone based He has only had the single event. His cardiac workup has not shown any high risk findings. Again discussed TEE to make sure that he does not have a small ASD, but his echo really does not show evidence of right heart overload/shunt. His cardioembolic risk is low. CHADSVasc 0. On aspirin 81 mg daily. We have reviewed options for radiofrequency ablation after his workup is complete, if the arrhythmia burden justifies it. 2. Abnormal ECG: Exercise for >11 minutes without angina on the Bruce protocol, normal perfusion pattern. No reason to suspect CAD 3. HLP:  Since he does not have known coronary/vascular disease, his current lipid profile, while not ideal, is acceptable. However, he should try harder with weight loss, exercise, improve diet. 4. Obesity: He is mildly obese and has evidence of prediabetes. Weight loss is strongly recommended. Daily mild-moderate exercise can be started now.  Discussed calorie restriction and a healthy diet.    Medication Adjustments/Labs and Tests Ordered: Current medicines are reviewed at length with the patient today.  Concerns regarding medicines are outlined above.  Medication changes, Labs and Tests ordered today are listed in the Patient Instructions below. Patient Instructions  Your physician has requested that you have a TEE. During a TEE, sound waves are used to create images of your heart. It provides your doctor with information about the size and shape of your heart and how well your heart's chambers and valves are working. In this test, a transducer is attached to the end of a flexible tube that's guided down your throat and into your esophagus (the tube leading from you mouth to your stomach) to get a more detailed image of your heart. You are not awake for the procedure. Please see the instruction sheet given to you today. For further information please visit [email or web address].  Dr Sallyanne Kuster recommends that you schedule a follow-up appointment in 6 months. You will receive a reminder letter in the mail two months in advance. If you don't receive a letter, please call  our office to schedule the follow-up appointment.  If you need a refill on your cardiac medications before your next appointment, please call your pharmacy.   Kardia device - [email or web address]    Signed, Sanda Klein, MD  10/01/2016 12:51 PM    [facility] [address], [place], [place]  [address] Phone: [phone]; Fax: [phone]

## 2016-09-29 NOTE — Patient Instructions (Signed)
Your physician has requested that you have a TEE. During a TEE, sound waves are used to create images of your heart. It provides your doctor with information about the size and shape of your heart and how well your heart's chambers and valves are working. In this test, a transducer is attached to the end of a flexible tube that's guided down your throat and into your esophagus (the tube leading from you mouth to your stomach) to get a more detailed image of your heart. You are not awake for the procedure. Please see the instruction sheet given to you today. For further information please visit [email or web address].  Dr Sallyanne Kuster recommends that you schedule a follow-up appointment in 6 months. You will receive a reminder letter in the mail two months in advance. If you don't receive a letter, please call our office to schedule the follow-up appointment.  If you need a refill on your cardiac medications before your next appointment, please call your pharmacy.   Kardia device - [email or web address]

## 2016-10-03 NOTE — Addendum Note (Signed)
Addended by: Milderd Meager on: 10/03/2016 03:13 PM   Modules accepted: Orders

## 2016-11-02 ENCOUNTER — Encounter (HOSPITAL_COMMUNITY)
Admission: RE | Disposition: A | Discharge status: Home / Self Care | Payer: Self-pay | Source: Ambulatory Visit | Attending: Cardiovascular Disease

## 2016-11-02 ENCOUNTER — Encounter (HOSPITAL_COMMUNITY): Payer: Self-pay

## 2016-11-02 ENCOUNTER — Ambulatory Visit (HOSPITAL_COMMUNITY)
Admission: RE | Admit: 2016-11-02 | Discharge: 2016-11-02 | Disposition: A | Discharge status: Home / Self Care | Payer: BC Managed Care – PPO | Source: Ambulatory Visit | Attending: Cardiovascular Disease | Admitting: Cardiovascular Disease

## 2016-11-02 ENCOUNTER — Ambulatory Visit (HOSPITAL_BASED_OUTPATIENT_CLINIC_OR_DEPARTMENT_OTHER): Payer: BC Managed Care – PPO

## 2016-11-02 DIAGNOSIS — I4892 Unspecified atrial flutter: Secondary | ICD-10-CM

## 2016-11-02 DIAGNOSIS — Z8249 Family history of ischemic heart disease and other diseases of the circulatory system: Secondary | ICD-10-CM | POA: Diagnosis not present

## 2016-11-02 DIAGNOSIS — Q211 Atrial septal defect, unspecified: Secondary | ICD-10-CM

## 2016-11-02 DIAGNOSIS — Z87891 Personal history of nicotine dependence: Secondary | ICD-10-CM | POA: Diagnosis not present

## 2016-11-02 DIAGNOSIS — Z7982 Long term (current) use of aspirin: Secondary | ICD-10-CM | POA: Diagnosis not present

## 2016-11-02 DIAGNOSIS — I088 Other rheumatic multiple valve diseases: Secondary | ICD-10-CM | POA: Diagnosis not present

## 2016-11-02 DIAGNOSIS — Z79899 Other long term (current) drug therapy: Secondary | ICD-10-CM | POA: Diagnosis not present

## 2016-11-02 DIAGNOSIS — I483 Typical atrial flutter: Secondary | ICD-10-CM

## 2016-11-02 DIAGNOSIS — Q2112 Patent foramen ovale: Secondary | ICD-10-CM

## 2016-11-02 HISTORY — DX: Patent foramen ovale: Q21.12

## 2016-11-02 HISTORY — PX: TEE WITHOUT CARDIOVERSION: SHX5443

## 2016-11-02 SURGERY — ECHOCARDIOGRAM, TRANSESOPHAGEAL
Anesthesia: Moderate Sedation

## 2016-11-02 MED ORDER — MIDAZOLAM HCL 10 MG/2ML IJ SOLN
INTRAMUSCULAR | Status: DC | PRN
  Administered 2016-11-02: 2 mg via INTRAVENOUS
  Administered 2016-11-02: 1 mg via INTRAVENOUS
  Administered 2016-11-02: 2 mg via INTRAVENOUS
  Administered 2016-11-02 (×2): 1 mg via INTRAVENOUS

## 2016-11-02 MED ORDER — DIPHENHYDRAMINE HCL 50 MG/ML IJ SOLN
INTRAMUSCULAR | Status: AC
  Filled 2016-11-02: qty 1

## 2016-11-02 MED ORDER — FENTANYL CITRATE (PF) 100 MCG/2ML IJ SOLN
INTRAMUSCULAR | Status: DC | PRN
  Administered 2016-11-02 (×3): 25 ug via INTRAVENOUS

## 2016-11-02 MED ORDER — MIDAZOLAM HCL 5 MG/ML IJ SOLN
INTRAMUSCULAR | Status: AC
  Filled 2016-11-02: qty 2

## 2016-11-02 MED ORDER — SODIUM CHLORIDE 0.9 % IV SOLN: INTRAVENOUS | Status: DC

## 2016-11-02 MED ORDER — BUTAMBEN-TETRACAINE-BENZOCAINE 2-2-14 % EX AERO
INHALATION_SPRAY | CUTANEOUS | Status: DC | PRN
  Administered 2016-11-02: 2 via TOPICAL

## 2016-11-02 MED ORDER — FENTANYL CITRATE (PF) 100 MCG/2ML IJ SOLN
INTRAMUSCULAR | Status: AC
  Filled 2016-11-02: qty 2

## 2016-11-02 NOTE — H&P (Signed)
@  [email or web address]@ Cardiology Office Note:    Date:  11/02/2016   ID:  Jon Hurst, DOB 1970/10/21, MRN [id number]  PCP:  Prince Solian, MD  Cardiologist:  Sanda Klein, MD    Referring MD: No ref. provider found   Chief complaint: TEE for ASD  History of Present Illness:    Jon Hurst is a 46 y.o. male with a hx of atrial flutter and possible ASD based on transthoracic echo, here for TEE to clarify the diagnosis.  Past medical history: atrial flutter  Past Surgical History:  Procedure Laterality Date  . ANTERIOR CRUCIATE LIGAMENT REPAIR    . SHOULDER ACROMIOPLASTY      Current Medications: Current Meds  Medication Sig  . aspirin EC 81 MG tablet Take 81 mg by mouth daily.     Allergies:   Patient has no known allergies.   Social History   Social History  . Marital status: Married    Spouse name: N/A  . Number of children: N/A  . Years of education: N/A   Social History Main Topics  . Smoking status: Former Smoker    Quit date: 12/08/2015  . Smokeless tobacco: Never Used  . Alcohol use Yes     Comment: ocassionally  . Drug use: No  . Sexual activity: Not Asked   Other Topics Concern  . None   Social History Narrative  . None     Family History: [name] family history includes COPD in his father; Colon cancer in his father; Heart failure in his mother. ROS:   Please see the history of present illness.     All other systems reviewed and are negative.  EKGs/Labs/Other Studies Reviewed:    Recent Labs: 01/20/2016: ALT 61; BUN 8; Creatinine, Ser 1.03; Hemoglobin 15.9; Platelets 170; Potassium 4.5; Sodium 141 01/21/2016: TSH 2.007  Recent Lipid Panel No results found for: CHOL, TRIG, HDL, CHOLHDL, VLDL, LDLCALC, LDLDIRECT  Physical Exam:    VS:  BP (!) 127/94   Pulse 69   Temp 98.3 F (36.8 C) (Oral)   Resp 15   Ht 6' (1.829 m)   Wt 220 lb (99.8 kg)   SpO2 97%   BMI 29.84 kg/m     Wt Readings from Last 3 Encounters:  11/02/16  220 lb (99.8 kg)  09/29/16 225 lb (102.1 kg)  03/21/16 225 lb (102.1 kg)     GEN:  Well nourished, well developed in no acute distress HEENT: Normal NECK: No JVD; No carotid bruits LYMPHATICS: No lymphadenopathy CARDIAC: RRR, no murmurs, rubs, gallops RESPIRATORY:  Clear to auscultation without rales, wheezing or rhonchi  ABDOMEN: Soft, non-tender, non-distended MUSCULOSKELETAL:  No edema; No deformity  SKIN: Warm and dry NEUROLOGIC:  Alert and oriented x 3 PSYCHIATRIC:  Normal affect   ASSESSMENT:    No diagnosis found. PLAN:    In order of problems listed above:  1. Atrial flutter 2. Possible ASD TEE procedure has been fully reviewed with the patient and written informed consent has been obtained.    Medication Adjustments/Labs and Tests Ordered: Current medicines are reviewed at length with the patient today.  Concerns regarding medicines are outlined above.  Orders Placed This Encounter  Procedures  . ECHO TEE   No orders of the defined types were placed in this encounter.   Signed, Sanda Klein, MD  11/02/2016 11:37 AM    [facility]

## 2016-11-02 NOTE — Progress Notes (Signed)
  Echocardiogram Echocardiogram Transesophageal has been performed.  Tresa Res 11/02/2016, 1:12 PM

## 2016-11-02 NOTE — Discharge Instructions (Signed)

## 2016-11-02 NOTE — Op Note (Signed)
INDICATIONS: ASD  PROCEDURE:   Informed consent was obtained prior to the procedure. The risks, benefits and alternatives for the procedure were discussed and the patient comprehended these risks.  Risks include, but are not limited to, cough, sore throat, vomiting, nausea, somnolence, esophageal and stomach trauma or perforation, bleeding, low blood pressure, aspiration, pneumonia, infection, trauma to the teeth and death.    After a procedural time-out, the oropharynx was anesthetized with 20% benzocaine spray.   During this procedure the patient was administered a total of Versed 7 mg and Fentanyl 75 mcg to achieve and maintain moderate conscious sedation.  [name] heart rate, blood pressure, and oxygen saturationweare monitored continuously during the procedure. The period of conscious sedation was 18 minutes, of which I was present face-to-face 100% of this time.  The transesophageal probe was inserted in the esophagus and stomach without difficulty and multiple views were obtained.  The patient was kept under observation until the patient left the procedure room.  The patient left the procedure room in stable condition.   Agitated microbubble saline contrast was not administered.  COMPLICATIONS:    There were no immediate complications.  FINDINGS:  Small PFO. No tru ASD. Minimal mitral valve prolapse.  RECOMMENDATIONS:    No further evaluation for structural heart disease.  Time Spent Directly with the Patient:  30 minutes   Deagan Sevin 11/02/2016, 12:20 PM

## 2016-11-03 ENCOUNTER — Encounter (HOSPITAL_COMMUNITY): Payer: Self-pay | Admitting: Cardiovascular Disease

## 2017-09-14 LAB — IFOBT (OCCULT BLOOD): IFOBT: NEGATIVE

## 2018-03-20 IMAGING — NM NM MISC PROCEDURE
6 series · 36 of 36 positions shown · non-contrast
Comparison: none

[Series 1: wbr_r-proj_st wbr rest · 6.40mm/px · 6 of 64 frames shown]
[frame 6/64]
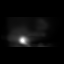
[frame 16/64]
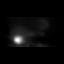
[frame 27/64]
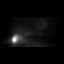
[frame 38/64]
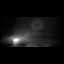
[frame 48/64]
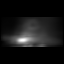
[frame 59/64]
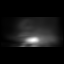

[Series 1: wbr rest · 6.40mm/px · 6 of 64 frames shown]
[frame 6/64]
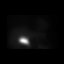
[frame 16/64]
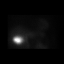
[frame 27/64]
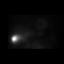
[frame 38/64]
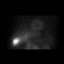
[frame 48/64]
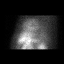
[frame 59/64]
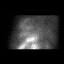

[Series 2: wbr_s-proj_st wbr stress-gsp · 6.40mm/px · 6 of 512 frames shown]
[frame 43/512]
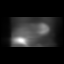
[frame 128/512]
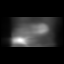
[frame 214/512]
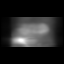
[frame 299/512]
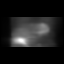
[frame 384/512]
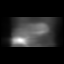
[frame 470/512]
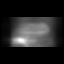

[Series 2: wbr stress-gsp · 6.40mm/px · 6 of 512 frames shown]
[frame 43/512]
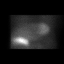
[frame 128/512]
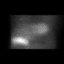
[frame 214/512]
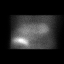
[frame 299/512]
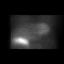
[frame 384/512]
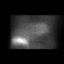
[frame 470/512]
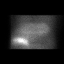

[Series 3: wbr stress-sum-em · 6.40mm/px · 6 of 64 frames shown]
[frame 6/64]
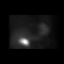
[frame 16/64]
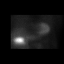
[frame 27/64]
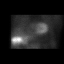
[frame 38/64]
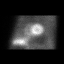
[frame 48/64]
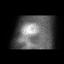
[frame 59/64]
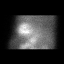

[Series 3: wbr_s-proj_st wbr stress-sum-em · 6.40mm/px · 6 of 64 frames shown]
[frame 6/64]
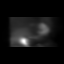
[frame 16/64]
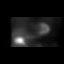
[frame 27/64]
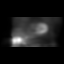
[frame 38/64]
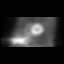
[frame 48/64]
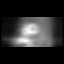
[frame 59/64]
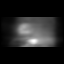

[36 of 36 positions shown; findings below may reference images not displayed]

Canned report from images found in remote index.

Refer to host system for actual result text.

## 2018-10-30 ENCOUNTER — Encounter: Payer: Self-pay | Admitting: Gastroenterology

## 2018-11-29 ENCOUNTER — Other Ambulatory Visit: Payer: Self-pay

## 2018-11-29 ENCOUNTER — Ambulatory Visit (AMBULATORY_SURGERY_CENTER): Payer: Self-pay

## 2018-11-29 ENCOUNTER — Encounter: Payer: Self-pay | Admitting: Gastroenterology

## 2018-11-29 VITALS — Temp 96.8°F | Ht 72.0 in | Wt 220.0 lb

## 2018-11-29 DIAGNOSIS — Z1211 Encounter for screening for malignant neoplasm of colon: Secondary | ICD-10-CM

## 2018-11-29 DIAGNOSIS — Z800 Family history of malignant neoplasm of digestive organs: Secondary | ICD-10-CM

## 2018-11-29 MED ORDER — NA SULFATE-K SULFATE-MG SULF 17.5-3.13-1.6 GM/177ML PO SOLN: 1.0000 | Freq: Once | ORAL | 0 refills | Status: AC

## 2018-11-29 NOTE — Progress Notes (Signed)
Denies allergies to eggs or soy products. Denies complication of anesthesia or sedation. Denies use of weight loss medication. Denies use of O2.   Emmi instructions given for colonoscopy.  Covid screening is scheduled for 12/10/18 @ 9:05. A 15.00 coupon for Suprep was given to the patient.

## 2018-12-10 ENCOUNTER — Other Ambulatory Visit: Payer: Self-pay | Admitting: Gastroenterology

## 2018-12-10 ENCOUNTER — Ambulatory Visit (INDEPENDENT_AMBULATORY_CARE_PROVIDER_SITE_OTHER): Payer: BC Managed Care – PPO

## 2018-12-10 DIAGNOSIS — Z1159 Encounter for screening for other viral diseases: Secondary | ICD-10-CM

## 2018-12-10 LAB — SARS CORONAVIRUS 2 (TAT 6-24 HRS): SARS Coronavirus 2: NEGATIVE

## 2018-12-13 ENCOUNTER — Ambulatory Visit (AMBULATORY_SURGERY_CENTER): Payer: BC Managed Care – PPO | Admitting: Gastroenterology

## 2018-12-13 ENCOUNTER — Other Ambulatory Visit: Payer: Self-pay

## 2018-12-13 ENCOUNTER — Encounter: Payer: Self-pay | Admitting: Gastroenterology

## 2018-12-13 VITALS — BP 119/83 | HR 55 | Temp 98.7°F | Resp 16 | Ht 72.0 in | Wt 220.0 lb

## 2018-12-13 DIAGNOSIS — D127 Benign neoplasm of rectosigmoid junction: Secondary | ICD-10-CM

## 2018-12-13 DIAGNOSIS — D125 Benign neoplasm of sigmoid colon: Secondary | ICD-10-CM | POA: Diagnosis not present

## 2018-12-13 DIAGNOSIS — Z1211 Encounter for screening for malignant neoplasm of colon: Secondary | ICD-10-CM

## 2018-12-13 DIAGNOSIS — D122 Benign neoplasm of ascending colon: Secondary | ICD-10-CM | POA: Diagnosis not present

## 2018-12-13 DIAGNOSIS — D128 Benign neoplasm of rectum: Secondary | ICD-10-CM | POA: Diagnosis not present

## 2018-12-13 DIAGNOSIS — D123 Benign neoplasm of transverse colon: Secondary | ICD-10-CM | POA: Diagnosis not present

## 2018-12-13 DIAGNOSIS — Z800 Family history of malignant neoplasm of digestive organs: Secondary | ICD-10-CM | POA: Diagnosis not present

## 2018-12-13 MED ORDER — SODIUM CHLORIDE 0.9 % IV SOLN: 500.0000 mL | INTRAVENOUS | Status: DC

## 2018-12-13 NOTE — Patient Instructions (Signed)
HANDOUTS GIVEN: POLYPS, DIVERTICULOSIS AND HEMORRHOIDS  AWAIT PATHOLOGY RESULTS.    YOU HAD AN ENDOSCOPIC PROCEDURE TODAY AT [facility]:   Refer to the procedure report that was given to you for any specific questions about what was found during the examination.  If the procedure report does not answer your questions, please call your gastroenterologist to clarify.  If you requested that your care partner not be given the details of your procedure findings, then the procedure report has been included in a sealed envelope for you to review at your convenience later.  YOU SHOULD EXPECT: Some feelings of bloating in the abdomen. Passage of more gas than usual.  Walking can help get rid of the air that was put into your GI tract during the procedure and reduce the bloating. If you had a lower endoscopy (such as a colonoscopy or flexible sigmoidoscopy) you may notice spotting of blood in your stool or on the toilet paper. If you underwent a bowel prep for your procedure, you may not have a normal bowel movement for a few days.  Please Note:  You might notice some irritation and congestion in your nose or some drainage.  This is from the oxygen used during your procedure.  There is no need for concern and it should clear up in a day or so.  SYMPTOMS TO REPORT IMMEDIATELY:   Following lower endoscopy (colonoscopy or flexible sigmoidoscopy):  Excessive amounts of blood in the stool  Significant tenderness or worsening of abdominal pains  Swelling of the abdomen that is new, acute  Fever of 100F or higher   For urgent or emergent issues, a gastroenterologist can be reached at any hour by calling [phone].   DIET:  We do recommend a small meal at first, but then you may proceed to your regular diet.  Drink plenty of fluids but you should avoid alcoholic beverages for 24 hours.  ACTIVITY:  You should plan to take it easy for the rest of today and you should NOT DRIVE or use  heavy machinery until tomorrow (because of the sedation medicines used during the test).    FOLLOW UP: Our staff will call the number listed on your records 48-72 hours following your procedure to check on you and address any questions or concerns that you may have regarding the information given to you following your procedure. If we do not reach you, we will leave a message.  We will attempt to reach you two times.  During this call, we will ask if you have developed any symptoms of COVID 19. If you develop any symptoms (ie: fever, flu-like symptoms, shortness of breath, cough etc.) before then, please call [phone].  If you test positive for Covid 19 in the 2 weeks post procedure, please call and report this information to [place].    If any biopsies were taken you will be contacted by phone or by letter within the next 1-3 weeks.  Please call us at [phone] if you have not heard about the biopsies in 3 weeks.    SIGNATURES/CONFIDENTIALITY: You and/or your care partner have signed paperwork which will be entered into your electronic medical record.  These signatures attest to the fact that that the information above on your After Visit Summary has been reviewed and is understood.  Full responsibility of the confidentiality of this discharge information lies with you and/or your care-partner.

## 2018-12-13 NOTE — Progress Notes (Signed)
Pt's states no medical or surgical changes since previsit  

## 2018-12-13 NOTE — Op Note (Signed)
[name] Patient Name: Jon Hurst Procedure Date: 12/13/2018 8:33 AM MRN: [id number] Endoscopist: Remo Lipps P. Havery Moros , MD Age: 48 Referring MD:  Date of Birth: [birth year] Gender: Male Account #: [id number] Procedure:                Colonoscopy Indications:              Screening patient at increased risk: Family history                            of 1st-degree relative with colorectal cancer                            (father diagnosed age 1s) Medicines:                Monitored Anesthesia Care Procedure:                Pre-Anesthesia Assessment:                           - Prior to the procedure, a History and Physical                            was performed, and patient medications and                            allergies were reviewed. [name] tolerance of                            previous anesthesia was also reviewed. The risks                            and benefits of the procedure and the sedation                            options and risks were discussed with the patient.                            All questions were answered, and informed consent                            was obtained. Prior Anticoagulants: The patient has                            taken no previous anticoagulant or antiplatelet                            agents. ASA Grade Assessment: I - A normal, healthy                            patient. After reviewing the risks and benefits,                            the patient was deemed in satisfactory condition to  undergo the procedure.                           After obtaining informed consent, the colonoscope                            was passed under direct vision. Throughout the                            procedure, the patient's blood pressure, pulse, and                            oxygen saturations were monitored continuously. The                            Colonoscope was introduced through the  anus and                            advanced to the the cecum, identified by                            appendiceal orifice and ileocecal valve. The                            colonoscopy was performed without difficulty. The                            patient tolerated the procedure well. The quality                            of the bowel preparation was good. The ileocecal                            valve, appendiceal orifice, and rectum were                            photographed. Scope In: 8:35:35 AM Scope Out: 8:56:23 AM Scope Withdrawal Time: 0 hours 18 minutes 54 seconds  Total Procedure Duration: 0 hours 20 minutes 48 seconds  Findings:                 The perianal and digital rectal examinations were                            normal.                           A 3 mm polyp was found in the ascending colon. The                            polyp was sessile. The polyp was removed with a                            cold snare. Resection and retrieval were complete.  A 6 mm polyp was found in the transverse colon. The                            polyp was sessile. The polyp was removed with a                            cold snare. Resection and retrieval were complete.                           Two sessile polyps were found in the sigmoid colon.                            The polyps were 3 mm in size. These polyps were                            removed with a cold snare. Resection and retrieval                            were complete.                           A diminutive polyp was found in the rectum. The                            polyp was sessile. The polyp was removed with a                            cold snare. Resection and retrieval were complete.                           A few medium-mouthed diverticula were found in the                            sigmoid colon.                           Internal hemorrhoids were found during                             retroflexion. The hemorrhoids were small.                           The exam was otherwise without abnormality. Complications:            No immediate complications. Estimated blood loss:                            Minimal. Estimated Blood Loss:     Estimated blood loss was minimal. Impression:               - One 3 mm polyp in the ascending colon, removed                            with a cold snare. Resected and retrieved.                           -  One 6 mm polyp in the transverse colon, removed                            with a cold snare. Resected and retrieved.                           - Two 3 mm polyps in the sigmoid colon, removed                            with a cold snare. Resected and retrieved.                           - One diminutive polyp in the rectum, removed with                            a cold snare. Resected and retrieved.                           - Diverticulosis in the sigmoid colon.                           - Internal hemorrhoids.                           - The examination was otherwise normal. Recommendation:           - Patient has a contact number available for                            emergencies. The signs and symptoms of potential                            delayed complications were discussed with the                            patient. Return to normal activities tomorrow.                            Written discharge instructions were provided to the                            patient.                           - Resume previous diet.                           - Continue present medications.                           - Await pathology results. Remo Lipps P. Melaysia Streed, MD 12/13/2018 9:00:51 AM This report has been signed electronically.

## 2018-12-13 NOTE — Progress Notes (Signed)
Called to room to assist during endoscopic procedure.  Patient ID and intended procedure confirmed with present staff. Received instructions for my participation in the procedure from the performing physician.  

## 2018-12-13 NOTE — Progress Notes (Signed)
PT taken to PACU. Monitors in place. VSS. Report given to RN. 

## 2018-12-17 ENCOUNTER — Telehealth: Payer: Self-pay

## 2018-12-17 NOTE — Telephone Encounter (Signed)
  Follow up Call-  Call back number 12/13/2018  Post procedure Call Back phone  # [phone]  Permission to leave phone message Yes  Some recent data might be hidden     Patient questions:  Do you have a fever, pain , or abdominal swelling? No. Pain Score  0 *  Have you tolerated food without any problems? Yes.    Have you been able to return to your normal activities? Yes.    Do you have any questions about your discharge instructions: Diet   No. Medications  No. Follow up visit  No.  Do you have questions or concerns about your Care? No.  Actions: * If pain score is 4 or above: No action needed, pain <4.  1. Have you developed a fever since your procedure? no  2.   Have you had an respiratory symptoms (SOB or cough) since your procedure? no  3.   Have you tested positive for COVID 19 since your procedure no  4.   Have you had any family members/close contacts diagnosed with the COVID 19 since your procedure?  no   If yes to any of these questions please route to Joylene John, RN and Alphonsa Gin, Therapist, sports.

## 2018-12-21 ENCOUNTER — Encounter: Payer: Self-pay | Admitting: Gastroenterology

## 2019-05-30 ENCOUNTER — Encounter: Payer: Self-pay | Admitting: Medical

## 2019-05-30 NOTE — Progress Notes (Signed)
Cardiology Office Note   Date:  06/02/2019   ID:  [name], DOB 15-Apr-1970, MRN [id number]  PCP:  Prince Solian, MD  Cardiologist:  Sanda Klein, MD EP: None  Chief Complaint  Patient presents with  . Follow-up    atrial flutter      History of Present Illness: ASLAM DAUN is a 49 y.o. male with PMH of paroxysmal atrial flutter, PFO, HLD, obesity, and former tobacco abuse (quit in 2017), who presents for routine follow-up.  He was last evaluated by cardiology in 2018 after recent diagnosis of atrial flutter. He was reported to have converted to NSR shortly after receiving IV diltiazem. He did not tolerate diltiazem and has been maintained on aspirin 81mg  daily as his CHA2DS2-VASc Score was 0. He underwent an exercise stress myoview which revealed subtle STD in several leads with normal perfusion and EF 62%. Echo showed an EF 5-60%, no RWMA, G1DD, and an atrial septal aneurysm. He underwent a TEE 10/2016 which showed EF 55-60%, no RWMA, trivial MVP, PFO with trivial, predominately L>R atrial level shunt, and no evidence of thrombus. He was since lost to follow-up.   He presents today for follow-up. He has been doing well since his last visit. No recurrent symptoms reminiscent of his episode of atrial flutter in 2017. He reported being very stressed at that time and had not eaten and was dehydrated the day symptoms started. He has been working to maintain his weight - was up to 240lbs recently and is down to 214lbs now. No complaints of chest pain at rest or with exertion, SOB, DOE, LE edema, orthopnea, PND, dizziness, lightheadedness, or syncope. No interval history of strokes. He reported trialing aspirin shortly after his atrial flutter diagnosis but had a vague intolerance which cleared up after discontinuing this medication.      Past Medical History:  Diagnosis Date  . Paroxysmal atrial flutter [facility])     Past Surgical History:  Procedure Laterality Date  .  ANTERIOR CRUCIATE LIGAMENT REPAIR    . SHOULDER ACROMIOPLASTY    . TEE WITHOUT CARDIOVERSION N/A 11/02/2016   Procedure: TRANSESOPHAGEAL ECHOCARDIOGRAM (TEE);  Surgeon: Sanda Klein, MD;  Location: [facility] ENDOSCOPY;  Service: Cardiovascular;  Laterality: N/A;     Current Outpatient Medications  Medication Sig Dispense Refill  . ibuprofen (ADVIL) 100 MG/5ML suspension Take 200 mg by mouth every 4 (four) hours as needed.     No current facility-administered medications for this visit.    Allergies:   Patient has no known allergies.    Social History:  The patient  reports that he quit smoking about 3 years ago. He has never used smokeless tobacco. He reports current alcohol use. He reports that he does not use drugs.   Family History:  [name] family history includes COPD in his father; Colon cancer in his father; Heart failure in his mother.    ROS:  Please see the history of present illness.   Otherwise, review of systems are positive for none.   All other systems are reviewed and negative.    PHYSICAL EXAM: VS:  BP 90/61   Pulse 60   Temp 98.4 F (36.9 C)   Ht 6' (1.829 m)   Wt 214 lb 9.6 oz (97.3 kg)   SpO2 98%   BMI 29.10 kg/m  , BMI Body mass index is 29.1 kg/m. GEN: Well nourished, well developed, in no acute distress HEENT: sclera anicteric Neck: no JVD, carotid bruits Cardiac: RRR;  no murmurs, rubs, or gallops, no edema  Respiratory:  clear to auscultation bilaterally, normal work of breathing GI: soft, nontender, nondistended, + BS MS: no deformity or atrophy Skin: warm and dry, no rash Neuro:  Strength and sensation are intact Psych: euthymic mood, full affect   EKG:  EKG is ordered today. The ekg ordered today demonstrates sinus rhythm with rate 60 bpm, no STE/D, no TWI, no significant change from previous.    Recent Labs: No results found for requested labs within last 8760 hours.    Lipid Panel No results found for: CHOL, TRIG, HDL, CHOLHDL,  VLDL, LDLCALC, LDLDIRECT    Wt Readings from Last 3 Encounters:  06/02/19 214 lb 9.6 oz (97.3 kg)  12/13/18 220 lb (99.8 kg)  11/29/18 220 lb (99.8 kg)      Other studies Reviewed: Additional studies/ records that were reviewed today include:   Exercise stress test 2018:  The left ventricular ejection fraction is normal (55-65%).  Nuclear stress EF: 62%.  Horizontal ST segment depression ST segment depression of 0.5 mm was noted during stress in the V2, V3, V4, V5 and V6 leads.  The study is normal.  This is a low risk study.   Low risk stress nuclear study with normal perfusion and normal left ventricular regional and global systolic function. ECG changes are seen with exercise, likely represent a borderline "false-positive" response.  Echocardiogram 2018:  - Left ventricle: The cavity size was normal. Wall thickness was  normal. Systolic function was normal. The estimated ejection  fraction was in the range of 55% to 60%. Wall motion was normal;  there were no regional wall motion abnormalities. Doppler  parameters are consistent with abnormal left ventricular  relaxation (grade 1 diastolic dysfunction).  - Atrial septum: There was an atrial septal aneurysm.   Impressions:   - Normal LV systolic function; grade 1 diastolic dysfunction; trace  MR and TR.   Transesophageal echocardiogram 2018: - Left ventricle: Systolic function was normal. The estimated  ejection fraction was in the range of 55% to 60%. Wall motion was  normal; there were no regional wall motion abnormalities. Left  ventricular diastolic function parameters were normal.  - Mitral valve: Trivial, late systolicprolapse, involving the  posterior leaflet.  - Left atrium: No evidence of thrombus in the atrial cavity or  appendage.  - Right atrium: No evidence of thrombus in the atrial cavity or  appendage.  - Atrial septum: There was a patent foramen ovale. Doppler showed a    trivial bidirectional, but predominantly left-to-right, atrial  level shunt, in the baseline state.  - Pulmonic valve: No evidence of vegetation.   ASSESSMENT AND PLAN:  1. Paroxysmal atrial flutter: one documented episode that occurred 12/2015. CHA2DS2-VASc Score continues to be 0. He did not tolerate aspirin. He was quite symptomatic at the time of his atrial flutter so anticipate he would identify recurrent atrial flutter.  - Continue to monitor for recurrence.   2. PFO: No strokes.  - Continue to monitor for now  3. HLD: LDL 112. Goal LDL <100. He reports losing weight since his last lipid check. - Continue routine monitoring per PCP - suggested considering starting a statin if LDL remains above goal at last check.   Current medicines are reviewed at length with the patient today.  The patient does not have concerns regarding medicines.  The following changes have been made:  As above  Labs/ tests ordered today include:   Orders Placed This Encounter  Procedures  . EKG 12-Lead     Disposition:   FU with Dr. Sallyanne Kuster in 2 years  Signed, Abigail Butts, PA-C  06/02/2019 8:42 AM

## 2019-06-02 ENCOUNTER — Ambulatory Visit: Payer: BC Managed Care – PPO | Admitting: Medical

## 2019-06-02 ENCOUNTER — Encounter: Payer: Self-pay | Admitting: Medical

## 2019-06-02 ENCOUNTER — Other Ambulatory Visit: Payer: Self-pay

## 2019-06-02 VITALS — BP 90/61 | HR 60 | Temp 98.4°F | Ht 72.0 in | Wt 214.6 lb

## 2019-06-02 DIAGNOSIS — I4892 Unspecified atrial flutter: Secondary | ICD-10-CM

## 2019-06-02 DIAGNOSIS — Q2112 Patent foramen ovale: Secondary | ICD-10-CM

## 2019-06-02 DIAGNOSIS — Q211 Atrial septal defect: Secondary | ICD-10-CM

## 2019-06-02 DIAGNOSIS — E7800 Pure hypercholesterolemia, unspecified: Secondary | ICD-10-CM

## 2019-06-02 NOTE — Patient Instructions (Signed)
Medication Instructions:  CONTINUE WITH CURRENT MEDICATIONS. NO CHANGES.  *If you need a refill on your cardiac medications before your next appointment, please call your pharmacy*    Follow-Up: At [facility], you and your health needs are our priority.  As part of our continuing mission to provide you with exceptional heart care, we have created designated Provider Care Teams.  These Care Teams include your primary Cardiologist (physician) and Advanced Practice Providers (APPs -  Physician Assistants and Nurse Practitioners) who all work together to provide you with the care you need, when you need it.  We recommend signing up for the patient portal called "MyChart".  Sign up information is provided on this After Visit Summary.  MyChart is used to connect with patients for Virtual Visits (Telemedicine).  Patients are able to view lab/test results, encounter notes, upcoming appointments, etc.  Non-urgent messages can be sent to your provider as well.   To learn more about what you can do with MyChart, go to [email or web address].    Your next appointment:   2 year(s)  The format for your next appointment:   In Person  Provider:   Sanda Klein, MD

## 2019-06-02 NOTE — Progress Notes (Signed)
TY, KK 

## 2020-08-05 DIAGNOSIS — S6982XD Other specified injuries of left wrist, hand and finger(s), subsequent encounter: Secondary | ICD-10-CM | POA: Diagnosis not present

## 2020-08-05 DIAGNOSIS — M79645 Pain in left finger(s): Secondary | ICD-10-CM | POA: Diagnosis not present

## 2020-11-09 DIAGNOSIS — E785 Hyperlipidemia, unspecified: Secondary | ICD-10-CM | POA: Diagnosis not present

## 2020-11-09 DIAGNOSIS — Z125 Encounter for screening for malignant neoplasm of prostate: Secondary | ICD-10-CM | POA: Diagnosis not present

## 2020-11-16 DIAGNOSIS — I4892 Unspecified atrial flutter: Secondary | ICD-10-CM | POA: Diagnosis not present

## 2020-11-16 DIAGNOSIS — Z0000 Encounter for general adult medical examination without abnormal findings: Secondary | ICD-10-CM | POA: Diagnosis not present

## 2020-11-16 DIAGNOSIS — Z1331 Encounter for screening for depression: Secondary | ICD-10-CM | POA: Diagnosis not present

## 2020-11-16 DIAGNOSIS — Z23 Encounter for immunization: Secondary | ICD-10-CM | POA: Diagnosis not present

## 2020-11-16 DIAGNOSIS — R82998 Other abnormal findings in urine: Secondary | ICD-10-CM | POA: Diagnosis not present

## 2020-11-16 DIAGNOSIS — Z1389 Encounter for screening for other disorder: Secondary | ICD-10-CM | POA: Diagnosis not present

## 2020-11-17 DIAGNOSIS — Z1212 Encounter for screening for malignant neoplasm of rectum: Secondary | ICD-10-CM | POA: Diagnosis not present

## 2021-05-20 DIAGNOSIS — E785 Hyperlipidemia, unspecified: Secondary | ICD-10-CM | POA: Diagnosis not present

## 2021-05-20 DIAGNOSIS — E669 Obesity, unspecified: Secondary | ICD-10-CM | POA: Diagnosis not present

## 2021-10-04 DIAGNOSIS — M25561 Pain in right knee: Secondary | ICD-10-CM | POA: Diagnosis not present

## 2021-11-23 DIAGNOSIS — E785 Hyperlipidemia, unspecified: Secondary | ICD-10-CM | POA: Diagnosis not present

## 2021-11-23 DIAGNOSIS — Z125 Encounter for screening for malignant neoplasm of prostate: Secondary | ICD-10-CM | POA: Diagnosis not present

## 2021-11-30 ENCOUNTER — Encounter: Payer: Self-pay | Admitting: Gastroenterology

## 2021-12-01 DIAGNOSIS — Z1212 Encounter for screening for malignant neoplasm of rectum: Secondary | ICD-10-CM | POA: Diagnosis not present

## 2021-12-01 DIAGNOSIS — Z23 Encounter for immunization: Secondary | ICD-10-CM | POA: Diagnosis not present

## 2021-12-01 DIAGNOSIS — I4892 Unspecified atrial flutter: Secondary | ICD-10-CM | POA: Diagnosis not present

## 2021-12-01 DIAGNOSIS — R82998 Other abnormal findings in urine: Secondary | ICD-10-CM | POA: Diagnosis not present

## 2021-12-01 DIAGNOSIS — Z0000 Encounter for general adult medical examination without abnormal findings: Secondary | ICD-10-CM | POA: Diagnosis not present

## 2021-12-02 ENCOUNTER — Other Ambulatory Visit: Payer: Self-pay | Admitting: Internal Medicine

## 2021-12-02 DIAGNOSIS — E785 Hyperlipidemia, unspecified: Secondary | ICD-10-CM

## 2021-12-13 ENCOUNTER — Encounter: Payer: Self-pay | Admitting: Gastroenterology

## 2021-12-27 ENCOUNTER — Telehealth: Payer: Self-pay | Admitting: *Deleted

## 2021-12-27 NOTE — Telephone Encounter (Signed)
Jon Hurst,  This pt is scheduled with Dr. Havery Moros on 02/03/22.  They have a PFO.  Thanks,  [name]

## 2022-01-02 ENCOUNTER — Ambulatory Visit: Payer: BC Managed Care – PPO

## 2022-01-02 VITALS — Ht 72.0 in | Wt 235.0 lb

## 2022-01-02 DIAGNOSIS — Z86010 Personal history of colonic polyps: Secondary | ICD-10-CM

## 2022-01-02 MED ORDER — NA SULFATE-K SULFATE-MG SULF 17.5-3.13-1.6 GM/177ML PO SOLN: 1.0000 | Freq: Once | ORAL | 0 refills | Status: AC

## 2022-01-11 ENCOUNTER — Other Ambulatory Visit: Payer: BC Managed Care – PPO

## 2022-01-12 ENCOUNTER — Ambulatory Visit
Admission: RE | Admit: 2022-01-12 | Discharge: 2022-01-12 | Disposition: A | Discharge status: Home / Self Care | Payer: No Typology Code available for payment source | Source: Ambulatory Visit | Attending: Internal Medicine | Admitting: Internal Medicine

## 2022-01-12 DIAGNOSIS — E785 Hyperlipidemia, unspecified: Secondary | ICD-10-CM

## 2022-01-31 ENCOUNTER — Encounter: Payer: Self-pay | Admitting: Certified Registered Nurse Anesthetist

## 2022-02-02 ENCOUNTER — Telehealth: Payer: Self-pay | Admitting: Gastroenterology

## 2022-02-02 NOTE — Telephone Encounter (Signed)
Returned patient call.  Encouraged patient to push extra fluids until the time to go NPO before his procedure.  Patient verbalized understanding.

## 2022-02-02 NOTE — Telephone Encounter (Signed)
Patient is calling regarding his procedure states he ate a handful of popcorn yesterday without thinking and is wondering what to do. Please advise

## 2022-02-03 ENCOUNTER — Ambulatory Visit (AMBULATORY_SURGERY_CENTER): Payer: BC Managed Care – PPO | Admitting: Gastroenterology

## 2022-02-03 ENCOUNTER — Encounter: Payer: Self-pay | Admitting: Gastroenterology

## 2022-02-03 VITALS — BP 127/79 | HR 67 | Temp 98.0°F | Resp 12 | Ht 72.0 in | Wt 235.0 lb

## 2022-02-03 DIAGNOSIS — D127 Benign neoplasm of rectosigmoid junction: Secondary | ICD-10-CM

## 2022-02-03 DIAGNOSIS — K621 Rectal polyp: Secondary | ICD-10-CM

## 2022-02-03 DIAGNOSIS — Z86010 Personal history of colonic polyps: Secondary | ICD-10-CM | POA: Diagnosis not present

## 2022-02-03 DIAGNOSIS — D123 Benign neoplasm of transverse colon: Secondary | ICD-10-CM

## 2022-02-03 DIAGNOSIS — Z800 Family history of malignant neoplasm of digestive organs: Secondary | ICD-10-CM

## 2022-02-03 DIAGNOSIS — Z09 Encounter for follow-up examination after completed treatment for conditions other than malignant neoplasm: Secondary | ICD-10-CM | POA: Diagnosis not present

## 2022-02-03 DIAGNOSIS — K635 Polyp of colon: Secondary | ICD-10-CM | POA: Diagnosis not present

## 2022-02-03 DIAGNOSIS — K6389 Other specified diseases of intestine: Secondary | ICD-10-CM | POA: Diagnosis not present

## 2022-02-03 DIAGNOSIS — D128 Benign neoplasm of rectum: Secondary | ICD-10-CM

## 2022-02-03 MED ORDER — SODIUM CHLORIDE 0.9 % IV SOLN: 500.0000 mL | Freq: Once | INTRAVENOUS | Status: DC

## 2022-02-03 NOTE — Progress Notes (Signed)
[facility] History and Physical   Primary Care Physician:  Prince Solian, MD   Reason for Procedure:   History of colon polyps  Plan:    colonoscopy     HPI: Jon Hurst is a 52 y.o. male  here for colonoscopy surveillance - history of 5 TAs / SSPs removed 11/2018 - father had CRC dx age 10s.   Patient denies any bowel symptoms at this time. Otherwise feels well without any cardiopulmonary symptoms.   I have discussed risks / benefits of anesthesia and endoscopic procedure with Jon Hurst and they wish to proceed with the exams as outlined today.    Past Medical History:  Diagnosis Date   Paroxysmal atrial flutter (HCC)    PFO (patent foramen ovale) 11/02/2016    Past Surgical History:  Procedure Laterality Date   ANTERIOR CRUCIATE LIGAMENT REPAIR     SHOULDER ACROMIOPLASTY     TEE WITHOUT CARDIOVERSION N/A 11/02/2016   Procedure: TRANSESOPHAGEAL ECHOCARDIOGRAM (TEE);  Surgeon: Sanda Klein, MD;  Location: [facility];  Service: Cardiovascular;  Laterality: N/A;    Prior to Admission medications   Medication Sig Start Date End Date Taking? Authorizing Provider  ibuprofen (ADVIL) 100 MG/5ML suspension Take 200 mg by mouth every 4 (four) hours as needed.    [provider]    Current Outpatient Medications  Medication Sig Dispense Refill   ibuprofen (ADVIL) 100 MG/5ML suspension Take 200 mg by mouth every 4 (four) hours as needed.     Current Facility-Administered Medications  Medication Dose Route Frequency Provider Last Rate Last Admin   0.9 %  sodium chloride infusion  500 mL Intravenous Once Jaliza Seifried, Carlota Raspberry, MD        Allergies as of 02/03/2022   (No Known Allergies)    Family History  Problem Relation Age of Onset   Heart failure Mother    Colon polyps Father    COPD Father    Colon cancer Father    Esophageal cancer Neg Hx    Stomach cancer Neg Hx    Rectal cancer Neg Hx     Social History    Socioeconomic History   Marital status: Married    Spouse name: Not on file   Number of children: Not on file   Years of education: Not on file   Highest education level: Not on file  Occupational History   Not on file  Tobacco Use   Smoking status: Former    Types: Cigarettes    Quit date: 12/08/2015    Years since quitting: 6.1   Smokeless tobacco: Never  Vaping Use   Vaping Use: Never used  Substance and Sexual Activity   Alcohol use: Yes    Comment: ocassionally   Drug use: No   Sexual activity: Not on file  Other Topics Concern   Not on file  Social History Narrative   Not on file   Social Determinants of Health   Financial Resource Strain: Not on file  Food Insecurity: Not on file  Transportation Needs: Not on file  Physical Activity: Not on file  Stress: Not on file  Social Connections: Not on file  Intimate Partner Violence: Not on file    Review of Systems: All other review of systems negative except as mentioned in the HPI.  Physical Exam: Vital signs BP 135/69   Pulse 65   Temp 98 F (36.7 C)   Ht 6' (1.829 m)   Wt 235 lb (106.6 kg)  SpO2 95%   BMI 31.87 kg/m   General:   Alert,  Well-developed, pleasant and cooperative in NAD Lungs:  Clear throughout to auscultation.   Heart:  Regular rate and rhythm Abdomen:  Soft, nontender and nondistended.   Neuro/Psych:  Alert and cooperative. Normal mood and affect. A and O x 3  Jolly Mango, MD [facility]

## 2022-02-03 NOTE — Op Note (Signed)
[facility] Patient Name: Jon Hurst Procedure Date: 02/03/2022 1:33 PM MRN: [id number] Endoscopist: Remo Lipps P. Havery Moros , MD, [id number] Age: 52 Referring MD:  Date of Birth: 1970/12/31 Gender: Male Account #: [id number] Procedure:                Colonoscopy Indications:              High risk colon cancer surveillance: Personal                            history of colonic polyps - 5 adenomas / SSPs                            removed 11/2018. Father had colon cancer dx age 60s Medicines:                Monitored Anesthesia Care Procedure:                Pre-Anesthesia Assessment:                           - Prior to the procedure, a History and Physical                            was performed, and patient medications and                            allergies were reviewed. [name] tolerance of                            previous anesthesia was also reviewed. The risks                            and benefits of the procedure and the sedation                            options and risks were discussed with the patient.                            All questions were answered, and informed consent                            was obtained. Prior Anticoagulants: The patient has                            taken no anticoagulant or antiplatelet agents. ASA                            Grade Assessment: II - A patient with mild systemic                            disease. After reviewing the risks and benefits,                            the patient was deemed in satisfactory condition to  undergo the procedure.                           After obtaining informed consent, the colonoscope                            was passed under direct vision. Throughout the                            procedure, the patient's blood pressure, pulse, and                            oxygen saturations were monitored continuously. The                             Colonoscope was introduced through the anus and                            advanced to the the cecum, identified by                            appendiceal orifice and ileocecal valve. The                            colonoscopy was performed without difficulty. The                            patient tolerated the procedure well. The quality                            of the bowel preparation was good. The ileocecal                            valve, appendiceal orifice, and rectum were                            photographed. Scope In: 1:34:09 PM Scope Out: 1:53:30 PM Scope Withdrawal Time: 0 hours 16 minutes 8 seconds  Total Procedure Duration: 0 hours 19 minutes 21 seconds  Findings:                 The perianal and digital rectal examinations were                            normal.                           A diminutive polyp was found in the transverse                            colon. The polyp was flat. The polyp was removed                            with a cold snare. Resection and retrieval were  complete.                           A 3 mm polyp was found in the distal sigmoid colon.                            The polyp was sessile. The polyp was removed with a                            cold snare. Resection and retrieval were complete.                           A 2 mm polyp was found in the rectum. The polyp was                            sessile. The polyp was removed with a cold snare.                            Resection and retrieval were complete.                           A few small-mouthed diverticula were found in the                            sigmoid colon.                           Internal hemorrhoids were found during                            retroflexion. The hemorrhoids were small.                           The exam was otherwise without abnormality. Complications:            No immediate complications. Estimated blood loss:                             Minimal. Estimated Blood Loss:     Estimated blood loss was minimal. Impression:               - One diminutive polyp in the transverse colon,                            removed with a cold snare. Resected and retrieved.                           - One 3 mm polyp in the distal sigmoid colon,                            removed with a cold snare. Resected and retrieved.                           - One 2 mm polyp in the rectum, removed with a cold  snare. Resected and retrieved.                           - Diverticulosis in the sigmoid colon.                           - Internal hemorrhoids.                           - The examination was otherwise normal. Recommendation:           - Patient has a contact number available for                            emergencies. The signs and symptoms of potential                            delayed complications were discussed with the                            patient. Return to normal activities tomorrow.                            Written discharge instructions were provided to the                            patient.                           - Resume previous diet.                           - Continue present medications.                           - Await pathology results. Anticipate repeat                            colonoscopy in 5 years Carlota Raspberry. [name], MD 02/03/2022 1:57:52 PM This report has been signed electronically.

## 2022-02-03 NOTE — Progress Notes (Signed)
Called to room to assist during endoscopic procedure.  Patient ID and intended procedure confirmed with present staff. Received instructions for my participation in the procedure from the performing physician.  

## 2022-02-03 NOTE — Progress Notes (Signed)
Report given to PACU, vss 

## 2022-02-03 NOTE — Progress Notes (Signed)
Pt's states no medical or surgical changes since previsit or office visit. 

## 2022-02-03 NOTE — Patient Instructions (Signed)
Await pathology results.  Handouts on polyps, diverticulosis, and hemorrhoids provided.  YOU HAD AN ENDOSCOPIC PROCEDURE TODAY AT [facility]:   Refer to the procedure report that was given to you for any specific questions about what was found during the examination.  If the procedure report does not answer your questions, please call your gastroenterologist to clarify.  If you requested that your care partner not be given the details of your procedure findings, then the procedure report has been included in a sealed envelope for you to review at your convenience later.  YOU SHOULD EXPECT: Some feelings of bloating in the abdomen. Passage of more gas than usual.  Walking can help get rid of the air that was put into your GI tract during the procedure and reduce the bloating. If you had a lower endoscopy (such as a colonoscopy or flexible sigmoidoscopy) you may notice spotting of blood in your stool or on the toilet paper. If you underwent a bowel prep for your procedure, you may not have a normal bowel movement for a few days.  Please Note:  You might notice some irritation and congestion in your nose or some drainage.  This is from the oxygen used during your procedure.  There is no need for concern and it should clear up in a day or so.  SYMPTOMS TO REPORT IMMEDIATELY:  Following lower endoscopy (colonoscopy or flexible sigmoidoscopy):  Excessive amounts of blood in the stool  Significant tenderness or worsening of abdominal pains  Swelling of the abdomen that is new, acute  Fever of 100F or higher   For urgent or emergent issues, a gastroenterologist can be reached at any hour by calling [phone]. Do not use MyChart messaging for urgent concerns.    DIET:  We do recommend a small meal at first, but then you may proceed to your regular diet.  Drink plenty of fluids but you should avoid alcoholic beverages for 24 hours.  ACTIVITY:  You should plan to take it easy  for the rest of today and you should NOT DRIVE or use heavy machinery until tomorrow (because of the sedation medicines used during the test).    FOLLOW UP: Our staff will call the number listed on your records the next business day following your procedure.  We will call around 7:15- 8:00 am to check on you and address any questions or concerns that you may have regarding the information given to you following your procedure. If we do not reach you, we will leave a message.     If any biopsies were taken you will be contacted by phone or by letter within the next 1-3 weeks.  Please call us at [phone] if you have not heard about the biopsies in 3 weeks.    SIGNATURES/CONFIDENTIALITY: You and/or your care partner have signed paperwork which will be entered into your electronic medical record.  These signatures attest to the fact that that the information above on your After Visit Summary has been reviewed and is understood.  Full responsibility of the confidentiality of this discharge information lies with you and/or your care-partner.

## 2022-02-06 ENCOUNTER — Telehealth: Payer: Self-pay

## 2022-02-06 NOTE — Telephone Encounter (Signed)
Left message on answering machine. 

## 2022-02-20 ENCOUNTER — Encounter: Payer: Self-pay | Admitting: Gastroenterology

## 2022-02-27 ENCOUNTER — Encounter: Payer: Self-pay | Admitting: Cardiovascular Disease

## 2022-02-27 ENCOUNTER — Ambulatory Visit: Payer: BC Managed Care – PPO | Attending: Cardiovascular Disease | Admitting: Cardiovascular Disease

## 2022-02-27 VITALS — BP 124/84 | HR 66 | Ht 72.0 in | Wt 243.0 lb

## 2022-02-27 DIAGNOSIS — I4510 Unspecified right bundle-branch block: Secondary | ICD-10-CM | POA: Diagnosis not present

## 2022-02-27 DIAGNOSIS — Q2112 Patent foramen ovale: Secondary | ICD-10-CM | POA: Diagnosis not present

## 2022-02-27 DIAGNOSIS — R0683 Snoring: Secondary | ICD-10-CM

## 2022-02-27 DIAGNOSIS — E669 Obesity, unspecified: Secondary | ICD-10-CM

## 2022-02-27 DIAGNOSIS — Z8679 Personal history of other diseases of the circulatory system: Secondary | ICD-10-CM | POA: Diagnosis not present

## 2022-02-27 DIAGNOSIS — E7800 Pure hypercholesterolemia, unspecified: Secondary | ICD-10-CM

## 2022-02-27 DIAGNOSIS — R400 Somnolence: Secondary | ICD-10-CM

## 2022-02-27 NOTE — Progress Notes (Unsigned)
Cardiology Office Note    Date:  02/28/2022   ID:  Jon Hurst, DOB 03/06/1970, MRN [id number]  PCP:  Prince Solian, MD  Cardiologist:   Sanda Klein, MD   Chief complaint     Occasional dyspnea at rest     History of Present Illness:  Jon Hurst is a 52 y.o. male presented with a single episode of atrial flutter in December 2018, that resolved spontaneously.  TEE showed evidence of a small patent foramen ovale with bidirectional shunt.  He has been doing great from a cardiovascular point of view and is asymptomatic. The patient specifically denies any chest pain at rest exertion, dyspnea at rest or with exertion, orthopnea, paroxysmal nocturnal dyspnea, syncope, palpitations, focal neurological deficits, intermittent claudication, lower extremity edema, unexplained weight gain, cough, hemoptysis or wheezing.   He is unaware of any palpitations, but he was not directly aware of the arrhythmia last December (presenting symptom was dyspnea).  He snores loudly.  He sometimes wakes up feeling refreshed if he sleeps long enough, but at times wakes up feeling very tired.  Occasionally takes afternoon naps.  Hard to finish a movie without falling asleep.  STOP-BANG score is 5.  He has gained weight and is now clearly obese with a BMI of about 33.  Lipid profile performed last November shows an LDL of 126.  He does not have diabetes.  Renal function is normal.  He has normal blood pressure.    Previous echo normal right and left heart chamber sizes with normal right and left ventricular systolic function and without wall hypertrophy or valvular abnormalities. 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 He had a good performance on the treadmill stress test (11.5 minutes on the Bruce protocol) with a hypertensive response. Despite the fact that he had subtle ST  segment depression in several leads had normal perfusion and normal LVEF 62%. He has never had a stroke or TIA and does not have chronic risk factors such as hypertension, diabetes, hyperlipidemia, known vascular disease, confirmed sleep apnea or COPD. He smoked roughly a pack of cigarettes weekly for about 20 years and quit in November 2017. He did not tolerate treatment with diltiazem due to fatigue. He gained weight after he quit smoking.  Past Surgical History:  Procedure Laterality Date   ANTERIOR CRUCIATE LIGAMENT REPAIR     SHOULDER ACROMIOPLASTY     TEE WITHOUT CARDIOVERSION N/A 11/02/2016   Procedure: TRANSESOPHAGEAL ECHOCARDIOGRAM (TEE);  Surgeon: Sanda Klein, MD;  Location: [facility];  Service: Cardiovascular;  Laterality: N/A;    Current Medications: Outpatient Medications Prior to Visit  Medication Sig Dispense Refill   ibuprofen (ADVIL) 100 MG/5ML suspension Take 200 mg by mouth every 4 (four) hours as needed.     No facility-administered medications prior to visit.     Allergies:   Patient has no known allergies.   Social History   Socioeconomic History   Marital status: Married    Spouse name: Not on file   Number of children: Not on file   Years of education: Not on file   Highest education level: Not on file  Occupational History   Not on file  Tobacco Use   Smoking status: Former    Types: Cigarettes    Quit date: 12/08/2015    Years since quitting: 6.2   Smokeless tobacco: Never  Vaping  Use   Vaping Use: Never used  Substance and Sexual Activity   Alcohol use: Yes    Comment: ocassionally   Drug use: No   Sexual activity: Not on file  Other Topics Concern   Not on file  Social History Narrative   Not on file   Social Determinants of Health   Financial Resource Strain: Not on file  Food Insecurity: Not on file  Transportation Needs: Not on file  Physical Activity: Not on file  Stress: Not on file  Social Connections: Not on file      Family History:  [name] Family history significant for the absence of first-degree relatives with cardiovascular disease.  ROS:   Please see the history of present illness.    ROS All other systems reviewed and are negative.   PHYSICAL EXAM:   VS:  BP 124/84 (BP Location: Left Arm, Patient Position: Sitting, Cuff Size: Large)   Pulse 66   Ht 6' (1.829 m)   Wt 243 lb (110.2 kg)   SpO2 97%   BMI 32.96 kg/m    GEN: Borderline obese, well developed, in no acute distress   General: Alert, oriented x3, no distress Head: no evidence of trauma, PERRL, EOMI, no exophtalmos or lid lag, no myxedema, no xanthelasma; normal ears, nose and oropharynx Neck: normal jugular venous pulsations and no hepatojugular reflux; brisk carotid pulses without delay and no carotid bruits Chest: clear to auscultation, no signs of consolidation by percussion or palpation, normal fremitus, symmetrical and full respiratory excursions Cardiovascular: normal position and quality of the apical impulse, regular rhythm, normal first and second heart sounds, no murmurs, rubs or gallops Abdomen: no tenderness or distention, no masses by palpation, no abnormal pulsatility or arterial bruits, normal bowel sounds, no hepatosplenomegaly Extremities: no clubbing, cyanosis or edema; 2+ radial, ulnar and brachial pulses bilaterally; 2+ right femoral, posterior tibial and dorsalis pedis pulses; 2+ left femoral, posterior tibial and dorsalis pedis pulses; no subclavian or femoral bruits Neurological: grossly nonfocal Psych: euthymic mood, full affect  Wt Readings from Last 3 Encounters:  02/27/22 243 lb (110.2 kg)  02/03/22 235 lb (106.6 kg)  01/02/22 235 lb (106.6 kg)      Studies/Labs Reviewed:   EKG:  EKG is ordered today.  I will sinus rhythm, incomplete right bundle branch block, T-wave inversion V1-V4  Recent Labs: No results found for requested labs within last 365 days.   Lipid Panel 07/13/2015: Total  cholesterol 200, LDL 129, HDL 44, triglycerides 137, A1c not checked, glucose 108, TSH 2.00, creatinine 1.03 08/15/2016: Total cholesterol 211, triglycerides 135, LDL 134, HDL 50   ASSESSMENT:    1. History of atrial flutter   2. Incomplete right bundle branch block   3. Patent foramen ovale   4. Mild obesity   5. Hypercholesterolemia   6. Snoring   7. Daytime somnolence      PLAN:  In order of problems listed above:  Atrial flutter: . CHADSVasc 0. On aspirin 81 mg daily. Options for radiofrequency ablation if the arrhythmia recurs and the burden justifies it. PFO: no specific treatment is needed, but would benfit from any intervention that prevents high right heart pressures (avoid obesity, OSA, etc). Incomplete RBBB: raises concern for right heart strain due to OSA HLP:  Prefer a lower LDL.  No known CAD/PAD, focus on weight loss, exercise, healthy diet. No meds at this point. Obesity: focus on weight loss, exercise, healthy diet. Hypersomnolence: recommend home sleep study. Discussed treatment options  if confirmed.    Medication Adjustments/Labs and Tests Ordered: Current medicines are reviewed at length with the patient today.  Concerns regarding medicines are outlined above.  Medication changes, Labs and Tests ordered today are listed in the Patient Instructions below. Patient Instructions  Medication Instructions:  No changes *If you need a refill on your cardiac medications before your next appointment, please call your pharmacy*  Testing/Procedures: WatchPAT?  Is a FDA cleared portable home sleep study test that uses a watch and 3 points of contact to monitor 7 different channels, including your heart rate, oxygen saturations, body position, snoring, and chest motion.  The study is easy to use from the comfort of your own home and accurately detect sleep apnea.  Before bed, you attach the chest sensor, attached the sleep apnea bracelet to your nondominant hand, and  attach the finger probe.  After the study, the raw data is downloaded from the watch and scored for apnea events.   For more information: [email or web address]  Patient Testing Instructions:  Do not put battery into the device until bedtime when you are ready to begin the test. Please call the support number if you need assistance after following the instructions below: 24 hour support line- [phone] or [facility] support at [phone] (option 2)  Download the [employer] AmericanWatchPAT One" app through the google play store or App Store  Be sure to turn on or enable access to bluetooth in settlings on your smartphone/ device  Make sure no other bluetooth devices are on and within the vicinity of your smartphone/ device and WatchPAT watch during testing.  Make sure to leave your smart phone/ device plugged in and charging all night.  When ready for bed:  Follow the instructions step by step in the WatchPAT One App to activate the testing device. For additional instructions, including video instruction, visit the WatchPAT One video on Youtube. You can search for [place] One within Youtube (video is 4 minutes and 18 seconds) or enter: https://youtube/watch?[email or web address]_vbiwxE Please note: You will be prompted to enter a Pin to connect via bluetooth when starting the test. The PIN will be assigned to you when you receive the test.  The device is disposable, but it recommended that you retain the device until you receive a call letting you know the study has been received and the results have been interpreted.  We will let you know if the study did not transmit to [place] properly after the test is completed. You do not need to call us to confirm the receipt of the test.  Please complete the test within 48 hours of receiving PIN.   Frequently Asked Questions:  What is Watch [place] one?  A single use fully disposable home sleep apnea testing device and will not need to be returned after completion.   What are the requirements to use WatchPAT one?  The be able to have a successful watchpat one sleep study, you should have your Watch pat one device, your smart phone, watch pat one app, your PIN number and Internet access What type of phone do I need?  You should have a smart phone that uses Android 5.1 and above or any Iphone with IOS 10 and above How can I download the WatchPAT one app?  Based on your device type search for WatchPAT one app either in google play for android devices or APP store for Iphone's Where will I get my PIN for the study?  Your PIN will be provided by your physician's office.  It is used for authentication and if you lose/forget your PIN, please reach out to your providers office.  I do not have Internet at home. Can I do WatchPAT one study?  WatchPAT One needs Internet connection throughout the night to be able to transmit the sleep data. You can use your home/local internet or your cellular's data package. However, it is always recommended to use home/local Internet. It is estimated that between 20MB-30MB will be used with each study.However, the application will be looking for 80MB space in the phone to start the study.  What happens if I lose internet or bluetooth connection?  During the internet disconnection, your phone will not be able to transmit the sleep data. All the data, will be stored in your phone. As soon as the internet connection is back on, the phone will being sending the sleep data. During the bluetooth disconnection, WatchPAT one will not be able to to send the sleep data to your phone. Data will be kept in the [facility] one until two devices have bluetooth connection back on. As soon as the connection is back on, WatchPAT one will send the sleep data to the phone.  How long do I need to wear the WatchPAT one?  After you start the study, you should wear the device at least 6 hours.  How far should I keep my phone from the device?  During the night,  your phone should be within 15 feet.  What happens if I leave the room for restroom or other reasons?  Leaving the room for any reason will not cause any problem. As soon as your get back to the room, both devices will reconnect and will continue to send the sleep data. Can I use my phone during the sleep study?  Yes, you can use your phone as usual during the study. But it is recommended to put your watchpat one on when you are ready to go to bed.  How will I get my study results?  A soon as you completed your study, your sleep data will be sent to the provider. They will then share the results with you when they are ready.     Follow-Up: At [facility], you and your health needs are our priority.  As part of our continuing mission to provide you with exceptional heart care, we have created designated Provider Care Teams.  These Care Teams include your primary Cardiologist (physician) and Advanced Practice Providers (APPs -  Physician Assistants and Nurse Practitioners) who all work together to provide you with the care you need, when you need it.  We recommend signing up for the patient portal called "MyChart".  Sign up information is provided on this After Visit Summary.  MyChart is used to connect with patients for Virtual Visits (Telemedicine).  Patients are able to view lab/test results, encounter notes, upcoming appointments, etc.  Non-urgent messages can be sent to your provider as well.   To learn more about what you can do with MyChart, go to [email or web address].    Your next appointment:   1 year(s)  Provider:   Sanda Klein, MD     Other Instructions      Signed, Sanda Klein, MD  02/28/2022 9:39 PM    [facility] [place], [place], [place]  [address] Phone: [phone]; Fax: [phone]  He does not take any prescription medications.

## 2022-02-27 NOTE — Patient Instructions (Signed)
Medication Instructions:  No changes *If you need a refill on your cardiac medications before your next appointment, please call your pharmacy*  Testing/Procedures: WatchPAT?  Is a FDA cleared portable home sleep study test that uses a watch and 3 points of contact to monitor 7 different channels, including your heart rate, oxygen saturations, body position, snoring, and chest motion.  The study is easy to use from the comfort of your own home and accurately detect sleep apnea.  Before bed, you attach the chest sensor, attached the sleep apnea bracelet to your nondominant hand, and attach the finger probe.  After the study, the raw data is downloaded from the watch and scored for apnea events.   For more information: [email or web address]  Patient Testing Instructions:  Do not put battery into the device until bedtime when you are ready to begin the test. Please call the support number if you need assistance after following the instructions below: 24 hour support line- [phone] or ITAMAR support at [phone] (option 2)  Download the [employer] First AmericanWatchPAT One" app through the google play store or App Store  Be sure to turn on or enable access to bluetooth in settlings on your smartphone/ device  Make sure no other bluetooth devices are on and within the vicinity of your smartphone/ device and WatchPAT watch during testing.  Make sure to leave your smart phone/ device plugged in and charging all night.  When ready for bed:  Follow the instructions step by step in the WatchPAT One App to activate the testing device. For additional instructions, including video instruction, visit the WatchPAT One video on Youtube. You can search for [place] One within Youtube (video is 4 minutes and 18 seconds) or enter: https://youtube/watch?[email or web address]_vbiwxE Please note: You will be prompted to enter a Pin to connect via bluetooth when starting the test. The PIN will be assigned to you when you  receive the test.  The device is disposable, but it recommended that you retain the device until you receive a call letting you know the study has been received and the results have been interpreted.  We will let you know if the study did not transmit to [place] properly after the test is completed. You do not need to call us to confirm the receipt of the test.  Please complete the test within 48 hours of receiving PIN.   Frequently Asked Questions:  What is Watch [place] one?  A single use fully disposable home sleep apnea testing device and will not need to be returned after completion.  What are the requirements to use WatchPAT one?  The be able to have a successful watchpat one sleep study, you should have your Watch pat one device, your smart phone, watch pat one app, your PIN number and Internet access What type of phone do I need?  You should have a smart phone that uses Android 5.1 and above or any Iphone with IOS 10 and above How can I download the WatchPAT one app?  Based on your device type search for WatchPAT one app either in google play for android devices or APP store for Iphone's Where will I get my PIN for the study?  Your PIN will be provided by your physician's office. It is used for authentication and if you lose/forget your PIN, please reach out to your providers office.  I do not have Internet at home. Can I do WatchPAT one study?  WatchPAT One needs Internet connection throughout the night to be able to transmit  the sleep data. You can use your home/local internet or your cellular's data package. However, it is always recommended to use home/local Internet. It is estimated that between 20MB-30MB will be used with each study.However, the application will be looking for 80MB space in the phone to start the study.  What happens if I lose internet or bluetooth connection?  During the internet disconnection, your phone will not be able to transmit the sleep data. All the data, will be  stored in your phone. As soon as the internet connection is back on, the phone will being sending the sleep data. During the bluetooth disconnection, WatchPAT one will not be able to to send the sleep data to your phone. Data will be kept in the [facility] one until two devices have bluetooth connection back on. As soon as the connection is back on, WatchPAT one will send the sleep data to the phone.  How long do I need to wear the WatchPAT one?  After you start the study, you should wear the device at least 6 hours.  How far should I keep my phone from the device?  During the night, your phone should be within 15 feet.  What happens if I leave the room for restroom or other reasons?  Leaving the room for any reason will not cause any problem. As soon as your get back to the room, both devices will reconnect and will continue to send the sleep data. Can I use my phone during the sleep study?  Yes, you can use your phone as usual during the study. But it is recommended to put your watchpat one on when you are ready to go to bed.  How will I get my study results?  A soon as you completed your study, your sleep data will be sent to the provider. They will then share the results with you when they are ready.     Follow-Up: At [facility], you and your health needs are our priority.  As part of our continuing mission to provide you with exceptional heart care, we have created designated Provider Care Teams.  These Care Teams include your primary Cardiologist (physician) and Advanced Practice Providers (APPs -  Physician Assistants and Nurse Practitioners) who all work together to provide you with the care you need, when you need it.  We recommend signing up for the patient portal called "MyChart".  Sign up information is provided on this After Visit Summary.  MyChart is used to connect with patients for Virtual Visits (Telemedicine).  Patients are able to view lab/test results, encounter notes,  upcoming appointments, etc.  Non-urgent messages can be sent to your provider as well.   To learn more about what you can do with MyChart, go to [email or web address].    Your next appointment:   1 year(s)  Provider:   Sanda Klein, MD     Other Instructions

## 2022-03-02 ENCOUNTER — Telehealth: Payer: Self-pay

## 2022-03-02 ENCOUNTER — Telehealth: Payer: Self-pay | Admitting: *Deleted

## 2022-03-02 NOTE — Telephone Encounter (Addendum)
Prior Authorization for [employer] sent to [employer] via web portal.  Patent attorney # [id number]. Valid dates 03/02/22 to 04/30/22. Secondary BC[employer] states no PA is required for sleep management. Debria Garret sent a secure chat message ok to activate the device.

## 2022-03-02 NOTE — Telephone Encounter (Signed)
Called and made the patient aware that HE may proceed with the [facility] Home Sleep Study. PIN # provided to the patient. Patient made aware that HE will be contacted after the test has been read with the results and any recommendations. Patient verbalized understanding and thanked me for the call.   

## 2022-04-12 ENCOUNTER — Ambulatory Visit: Payer: BC Managed Care – PPO | Attending: Cardiovascular Disease

## 2022-04-12 DIAGNOSIS — R0683 Snoring: Secondary | ICD-10-CM

## 2022-04-12 DIAGNOSIS — R400 Somnolence: Secondary | ICD-10-CM

## 2022-04-25 ENCOUNTER — Telehealth: Payer: Self-pay | Admitting: Emergency Medicine

## 2022-04-25 NOTE — Telephone Encounter (Signed)
Called pt to follow up on the [facility] Sleep study. Pt said that he completed this probably around the first of March.  Will ask about how long results take to come in and will follow up with patient.

## 2022-06-06 DIAGNOSIS — E669 Obesity, unspecified: Secondary | ICD-10-CM | POA: Diagnosis not present

## 2022-06-06 DIAGNOSIS — R109 Unspecified abdominal pain: Secondary | ICD-10-CM | POA: Diagnosis not present

## 2022-06-06 DIAGNOSIS — I4892 Unspecified atrial flutter: Secondary | ICD-10-CM | POA: Diagnosis not present

## 2022-06-06 DIAGNOSIS — M545 Low back pain, unspecified: Secondary | ICD-10-CM | POA: Diagnosis not present

## 2022-06-15 ENCOUNTER — Ambulatory Visit
Admission: RE | Admit: 2022-06-15 | Discharge: 2022-06-15 | Disposition: A | Discharge status: Home / Self Care | Payer: BC Managed Care – PPO | Source: Ambulatory Visit | Attending: Internal Medicine | Admitting: Internal Medicine

## 2022-06-15 ENCOUNTER — Other Ambulatory Visit: Payer: Self-pay | Admitting: Internal Medicine

## 2022-06-15 DIAGNOSIS — R109 Unspecified abdominal pain: Secondary | ICD-10-CM

## 2022-06-15 DIAGNOSIS — R1031 Right lower quadrant pain: Secondary | ICD-10-CM | POA: Diagnosis not present

## 2022-06-15 DIAGNOSIS — K760 Fatty (change of) liver, not elsewhere classified: Secondary | ICD-10-CM | POA: Diagnosis not present

## 2022-07-21 DIAGNOSIS — E785 Hyperlipidemia, unspecified: Secondary | ICD-10-CM | POA: Diagnosis not present

## 2022-07-21 DIAGNOSIS — R109 Unspecified abdominal pain: Secondary | ICD-10-CM | POA: Diagnosis not present

## 2022-07-24 DIAGNOSIS — I700 Atherosclerosis of aorta: Secondary | ICD-10-CM | POA: Diagnosis not present

## 2022-09-14 ENCOUNTER — Other Ambulatory Visit: Payer: Self-pay | Admitting: Family Medicine

## 2022-09-14 DIAGNOSIS — M5135 Other intervertebral disc degeneration, thoracolumbar region: Secondary | ICD-10-CM

## 2022-10-02 ENCOUNTER — Ambulatory Visit
Admission: RE | Admit: 2022-10-02 | Discharge: 2022-10-02 | Disposition: A | Discharge status: Home / Self Care | Payer: BC Managed Care – PPO | Source: Ambulatory Visit | Attending: Family Medicine | Admitting: Family Medicine

## 2022-10-02 DIAGNOSIS — M5135 Other intervertebral disc degeneration, thoracolumbar region: Secondary | ICD-10-CM

## 2022-10-02 DIAGNOSIS — M5136 Other intervertebral disc degeneration, lumbar region: Secondary | ICD-10-CM | POA: Diagnosis not present

## 2022-10-02 DIAGNOSIS — M545 Low back pain, unspecified: Secondary | ICD-10-CM | POA: Diagnosis not present

## 2022-10-02 DIAGNOSIS — M47816 Spondylosis without myelopathy or radiculopathy, lumbar region: Secondary | ICD-10-CM | POA: Diagnosis not present

## 2022-10-02 DIAGNOSIS — M5126 Other intervertebral disc displacement, lumbar region: Secondary | ICD-10-CM | POA: Diagnosis not present

## 2022-10-10 DIAGNOSIS — M5136 Other intervertebral disc degeneration, lumbar region: Secondary | ICD-10-CM | POA: Diagnosis not present

## 2022-10-10 DIAGNOSIS — Z6833 Body mass index (BMI) 33.0-33.9, adult: Secondary | ICD-10-CM | POA: Diagnosis not present

## 2022-10-10 DIAGNOSIS — M5134 Other intervertebral disc degeneration, thoracic region: Secondary | ICD-10-CM | POA: Diagnosis not present

## 2022-10-14 ENCOUNTER — Emergency Department (HOSPITAL_BASED_OUTPATIENT_CLINIC_OR_DEPARTMENT_OTHER)
Admission: EM | Admit: 2022-10-14 | Discharge: 2022-10-15 | Disposition: A | Discharge status: Home / Self Care | Payer: BC Managed Care – PPO | Attending: Emergency Medicine | Admitting: Emergency Medicine

## 2022-10-14 ENCOUNTER — Emergency Department (HOSPITAL_BASED_OUTPATIENT_CLINIC_OR_DEPARTMENT_OTHER): Payer: BC Managed Care – PPO

## 2022-10-14 ENCOUNTER — Encounter (HOSPITAL_BASED_OUTPATIENT_CLINIC_OR_DEPARTMENT_OTHER): Payer: Self-pay | Admitting: Emergency Medicine

## 2022-10-14 ENCOUNTER — Other Ambulatory Visit: Payer: Self-pay

## 2022-10-14 DIAGNOSIS — R7309 Other abnormal glucose: Secondary | ICD-10-CM | POA: Insufficient documentation

## 2022-10-14 DIAGNOSIS — K2980 Duodenitis without bleeding: Secondary | ICD-10-CM | POA: Insufficient documentation

## 2022-10-14 DIAGNOSIS — R0789 Other chest pain: Secondary | ICD-10-CM | POA: Diagnosis not present

## 2022-10-14 DIAGNOSIS — R7989 Other specified abnormal findings of blood chemistry: Secondary | ICD-10-CM | POA: Insufficient documentation

## 2022-10-14 DIAGNOSIS — K8020 Calculus of gallbladder without cholecystitis without obstruction: Secondary | ICD-10-CM | POA: Diagnosis not present

## 2022-10-14 DIAGNOSIS — K5750 Diverticulosis of both small and large intestine without perforation or abscess without bleeding: Secondary | ICD-10-CM | POA: Diagnosis not present

## 2022-10-14 DIAGNOSIS — R0602 Shortness of breath: Secondary | ICD-10-CM | POA: Diagnosis not present

## 2022-10-14 DIAGNOSIS — R918 Other nonspecific abnormal finding of lung field: Secondary | ICD-10-CM | POA: Diagnosis not present

## 2022-10-14 DIAGNOSIS — R1013 Epigastric pain: Secondary | ICD-10-CM | POA: Diagnosis not present

## 2022-10-14 DIAGNOSIS — Z4789 Encounter for other orthopedic aftercare: Secondary | ICD-10-CM | POA: Diagnosis not present

## 2022-10-14 DIAGNOSIS — K828 Other specified diseases of gallbladder: Secondary | ICD-10-CM | POA: Diagnosis not present

## 2022-10-14 LAB — CBC
HCT: 39.9 % (ref 39.0–52.0)
Hemoglobin: 13.6 g/dL (ref 13.0–17.0)
MCH: 28.4 pg (ref 26.0–34.0)
MCHC: 34.1 g/dL (ref 30.0–36.0)
MCV: 83.3 fL (ref 80.0–100.0)
Platelets: 152 10*3/uL (ref 150–400)
RBC: 4.79 MIL/uL (ref 4.22–5.81)
RDW: 13.6 % (ref 11.5–15.5)
WBC: 4.9 10*3/uL (ref 4.0–10.5)
nRBC: 0 % (ref 0.0–0.2)

## 2022-10-14 LAB — BASIC METABOLIC PANEL
Anion gap: 11 (ref 5–15)
BUN: 12 mg/dL (ref 6–20)
CO2: 25 mmol/L (ref 22–32)
Calcium: 9 mg/dL (ref 8.9–10.3)
Chloride: 99 mmol/L (ref 98–111)
Creatinine, Ser: 0.83 mg/dL (ref 0.61–1.24)
GFR, Estimated: >60 mL/min (ref >60)
Glucose, Bld: 116 mg/dL — ABNORMAL HIGH (ref 70–99)
Potassium: 3.6 mmol/L (ref 3.5–5.1)
Sodium: 135 mmol/L (ref 135–145)

## 2022-10-14 LAB — TROPONIN I (HIGH SENSITIVITY)
Troponin I (High Sensitivity): 7 ng/L (ref <18)
Troponin I (High Sensitivity): 7 ng/L (ref <18)

## 2022-10-14 NOTE — ED Triage Notes (Signed)
Patient with chest pressure, states that it feels like bad heartburn.  He states that he is having some shortness of breath with the pressure.  It has been going on all day, off and on.  It started getting worse around 3pm today.  Having a lot of belching with the pain.

## 2022-10-14 NOTE — ED Notes (Addendum)
Pt ambulatory from triage, steady gait.

## 2022-10-15 ENCOUNTER — Emergency Department (HOSPITAL_BASED_OUTPATIENT_CLINIC_OR_DEPARTMENT_OTHER): Payer: BC Managed Care – PPO

## 2022-10-15 ENCOUNTER — Encounter (HOSPITAL_BASED_OUTPATIENT_CLINIC_OR_DEPARTMENT_OTHER): Payer: Self-pay

## 2022-10-15 DIAGNOSIS — K8020 Calculus of gallbladder without cholecystitis without obstruction: Secondary | ICD-10-CM | POA: Diagnosis not present

## 2022-10-15 DIAGNOSIS — K5750 Diverticulosis of both small and large intestine without perforation or abscess without bleeding: Secondary | ICD-10-CM | POA: Diagnosis not present

## 2022-10-15 DIAGNOSIS — K828 Other specified diseases of gallbladder: Secondary | ICD-10-CM | POA: Diagnosis not present

## 2022-10-15 DIAGNOSIS — K2980 Duodenitis without bleeding: Secondary | ICD-10-CM | POA: Diagnosis not present

## 2022-10-15 LAB — HEPATIC FUNCTION PANEL
ALT: 305 U/L — ABNORMAL HIGH (ref 0–44)
AST: 227 U/L — ABNORMAL HIGH (ref 15–41)
Albumin: 3.6 g/dL (ref 3.5–5.0)
Alkaline Phosphatase: 250 U/L — ABNORMAL HIGH (ref 38–126)
Bilirubin, Direct: 1.8 mg/dL — ABNORMAL HIGH (ref 0.0–0.2)
Indirect Bilirubin: 1.2 mg/dL — ABNORMAL HIGH (ref 0.3–0.9)
Total Bilirubin: 3 mg/dL — ABNORMAL HIGH (ref 0.3–1.2)
Total Protein: 7 g/dL (ref 6.5–8.1)

## 2022-10-15 LAB — LIPASE, BLOOD: Lipase: 27 U/L (ref 11–51)

## 2022-10-15 MED ORDER — IOHEXOL 300 MG/ML  SOLN
100.0000 mL | Freq: Once | INTRAMUSCULAR | Status: AC | PRN
  Administered 2022-10-15: 100 mL via INTRAVENOUS

## 2022-10-15 MED ORDER — PANTOPRAZOLE SODIUM 40 MG IV SOLR
40.0000 mg | Freq: Once | INTRAVENOUS | Status: AC
  Administered 2022-10-15: 40 mg via INTRAVENOUS
  Filled 2022-10-15: qty 10

## 2022-10-15 MED ORDER — MORPHINE SULFATE (PF) 4 MG/ML IV SOLN
4.0000 mg | Freq: Once | INTRAVENOUS | Status: AC
  Administered 2022-10-15: 4 mg via INTRAVENOUS
  Filled 2022-10-15: qty 1

## 2022-10-15 MED ORDER — PANTOPRAZOLE SODIUM 40 MG PO TBEC: 40.0000 mg | DELAYED_RELEASE_TABLET | Freq: Every day | ORAL | 0 refills | Status: AC

## 2022-10-15 NOTE — Discharge Instructions (Signed)
Stop taking meloxicam, do not take any non-steroidal anti-inflammatory medications (ibuprofen, naproxen).  You may take antacids as needed.  You may take acetaminophen.

## 2022-10-15 NOTE — ED Provider Notes (Incomplete)
EMERGENCY DEPARTMENT AT [facility] Provider Note   CSN: [id number] Arrival date & time: 10/14/22  2022     History {Add pertinent medical, surgical, social history, OB history to HPI:1} Chief Complaint  Patient presents with  . Chest Pain  . Shortness of Breath    Jon Hurst is a 52 y.o. male.  The history is provided by the patient.  Chest Pain Associated symptoms: shortness of breath   Shortness of Breath Associated symptoms: chest pain        Home Medications Prior to Admission medications   Not on File      Allergies    Patient has no known allergies.    Review of Systems   Review of Systems  Respiratory:  Positive for shortness of breath.   Cardiovascular:  Positive for chest pain.  All other systems reviewed and are negative.   Physical Exam Updated Vital Signs BP (!) 143/91   Pulse 73   Temp 98.8 F (37.1 C) (Oral)   Resp 17   SpO2 98%  Physical Exam Vitals and nursing note reviewed.   52 year old male, resting comfortably and in no acute distress. Vital signs are ***. Oxygen saturation is ***%, which is normal. Head is normocephalic and atraumatic. PERRLA, EOMI. Oropharynx is clear. Neck is nontender and supple without adenopathy or JVD. Back is nontender and there is no CVA tenderness. Lungs are clear without rales, wheezes, or rhonchi. Chest is nontender. Heart has regular rate and rhythm without murmur. Abdomen is soft, flat, nontender without masses or hepatosplenomegaly and peristalsis is normoactive. Extremities have no cyanosis or edema, full range of motion is present. Skin is warm and dry without rash. Neurologic: Mental status is normal, cranial nerves are intact, there are no motor or sensory deficits.  ED Results / Procedures / Treatments   Labs (all labs ordered are listed, but only abnormal results are displayed) Labs Reviewed  BASIC METABOLIC PANEL - Abnormal; Notable for the following components:       Result Value   Glucose, Bld 116 (*)    All other components within normal limits  CBC  TROPONIN I (HIGH SENSITIVITY)  TROPONIN I (HIGH SENSITIVITY)    EKG None  Radiology DG Chest 2 View  Result Date: 10/14/2022 CLINICAL DATA:  Shortness of breath and chest pressure EXAM: CHEST - 2 VIEW COMPARISON:  Chest radiograph dated 01/20/2016 FINDINGS: Normal lung volumes. Bilateral lower lung linear opacities. No pleural effusion or pneumothorax. The heart size and mediastinal contours are within normal limits. Left clavicular fixation hardware appears intact. IMPRESSION: Bilateral lower lung linear opacities, likely atelectasis. Electronically Signed   By: Agustin Cree M.D.   On: 10/14/2022 21:35    Procedures Procedures  {Document cardiac monitor, telemetry assessment procedure when appropriate:1}  Medications Ordered in ED Medications - No data to display  ED Course/ Medical Decision Making/ A&P   {   Click here for ABCD2, HEART and other calculatorsREFRESH Note before signing :1}                              Medical Decision Making Amount and/or Complexity of Data Reviewed Labs: ordered. Radiology: ordered.   ***  {Document critical care time when appropriate:1} {Document review of labs and clinical decision tools ie heart score, Chads2Vasc2 etc:1}  {Document your independent review of radiology images, and any outside records:1} {Document your discussion with family members, caretakers,  and with consultants:1} {Document social determinants of health affecting pt's care:1} {Document your decision making why or why not admission, treatments were needed:1} Final Clinical Impression(s) / ED Diagnoses Final diagnoses:  None    Rx / DC Orders ED Discharge Orders     None

## 2022-10-15 NOTE — ED Provider Notes (Signed)
[place] EMERGENCY DEPARTMENT AT [facility] Provider Note   CSN: [id number] Arrival date & time: 10/14/22  2022     History  Chief Complaint  Patient presents with   Chest Pain   Shortness of Breath    Jon Hurst is a 52 y.o. male.  The history is provided by the patient.  Chest Pain Associated symptoms: shortness of breath   Shortness of Breath Associated symptoms: chest pain   He has history of atrial flutter not on anticoagulation, hyperlipidemia and comes in because of chest pain.  Pain is actually more epigastric.  He states that 5 days ago he was awakened at 4 AM with severe burning pain which lasted about an hour before resolving.  The same occurred the next day.  3 days ago, he started running fevers which lasted for about 2 days.  Home test for COVID-19 was negative.  Yesterday, he felt somewhat washed out but no other specific symptoms.  Through the day today, he has had worse pain which again is mainly in the epigastric area without radiation.  There is associated dyspnea and diaphoresis but no nausea.  He did try taking famotidine without any relief.  Pain is not affected by body position, exertion, eating.  Of note, he has been on meloxicam for about 1 month because of back pain.  He is a non-smoker and denies history of hypertension or diabetes and there is no family history of premature coronary atherosclerosis.  He did have a CT scan several months ago which did show gallstones.   Home Medications Prior to Admission medications   Not on File      Allergies    Patient has no known allergies.    Review of Systems   Review of Systems  Respiratory:  Positive for shortness of breath.   Cardiovascular:  Positive for chest pain.  All other systems reviewed and are negative.   Physical Exam Updated Vital Signs BP (!) 143/91   Pulse 73   Temp 98.8 F (37.1 C) (Oral)   Resp 17   SpO2 98%  Physical Exam Vitals and nursing note reviewed.   52  year old male, resting comfortably and in no acute distress. Vital signs are significant for borderline elevated blood pressure. Oxygen saturation is 98%, which is normal. Head is normocephalic and atraumatic. PERRLA, EOMI. Oropharynx is clear. Neck is nontender and supple without adenopathy or JVD. Back is nontender and there is no CVA tenderness. Lungs are clear without rales, wheezes, or rhonchi. Chest is nontender. Heart has regular rate and rhythm without murmur. Abdomen is soft, flat, with mild epigastric/right upper quadrant tenderness with +/- Murphy sign.  No other abdominal tenderness. Extremities have no cyanosis or edema, full range of motion is present. Skin is warm and dry without rash. Neurologic: Mental status is normal, cranial nerves are intact, moves all extremities equally.  ED Results / Procedures / Treatments   Labs (all labs ordered are listed, but only abnormal results are displayed) Labs Reviewed  BASIC METABOLIC PANEL - Abnormal; Notable for the following components:      Result Value   Glucose, Bld 116 (*)    All other components within normal limits  CBC  HEPATIC FUNCTION PANEL  LIPASE, BLOOD  TROPONIN I (HIGH SENSITIVITY)  TROPONIN I (HIGH SENSITIVITY)    EKG EKG Interpretation Date/Time:  Saturday October 14 2022 20:30:28 EDT Ventricular Rate:  80 PR Interval:  127 QRS Duration:  100 QT Interval:  364 QTC Calculation: 420 R Axis:   10  Text Interpretation: Sinus rhythm RSR' in V1 or V2, right VCD or RVH When compared with ECG of 01/20/2016, No significant change was found Confirmed by Dione Booze ([id number]) on 10/15/2022 12:04:25 AM  Radiology CT ABDOMEN PELVIS W CONTRAST  Result Date: 10/15/2022 CLINICAL DATA:  Chest pressure that "feels like bad heartburn". EXAM: CT ABDOMEN AND PELVIS WITH CONTRAST TECHNIQUE: Multidetector CT imaging of the abdomen and pelvis was performed using the standard protocol following bolus administration of  intravenous contrast. RADIATION DOSE REDUCTION: This exam was performed according to the departmental dose-optimization program which includes automated exposure control, adjustment of the mA and/or kV according to patient size and/or use of iterative reconstruction technique. CONTRAST:  OMNIPAQUE IOHEXOL 300 MG/ML  SOLN COMPARISON:  Jun 15, 2022 FINDINGS: Lower chest: Mild linear atelectasis is seen within the bilateral lung bases. Hepatobiliary: There is diffuse fatty infiltration of the liver parenchyma. No focal liver abnormality is seen. A few subcentimeter gallstones are seen within the dependent portion of a moderately distended gallbladder. There is no evidence of gallbladder wall thickening, pericholecystic inflammation or biliary dilatation. Pancreas: Unremarkable. No pancreatic ductal dilatation or surrounding inflammatory changes. Spleen: Normal in size without focal abnormality. Adrenals/Urinary Tract: Adrenal glands are unremarkable. Kidneys are normal, without renal calculi, focal lesion, or hydronephrosis. Bladder is unremarkable. Stomach/Bowel: Stomach is within normal limits. Appendix appears normal. Are stable transverse duodenal diverticulum is seen. Mild to moderate severity thickened and inflamed proximal duodenum is noted. No evidence of bowel dilatation. Noninflamed diverticula are seen within the proximal sigmoid colon. Vascular/Lymphatic: Aortic atherosclerosis. A left-sided infrarenal inferior vena cava is noted. No enlarged abdominal or pelvic lymph nodes. Reproductive: Prostate is unremarkable. Other: No abdominal wall hernia or abnormality. No abdominopelvic ascites. Musculoskeletal: Marked severity multilevel degenerative changes are seen throughout the lumbar spine. IMPRESSION: 1. Cholelithiasis without evidence of acute cholecystitis. 2. Mild to moderate severity proximal duodenitis. 3. Sigmoid diverticulosis. 4. Left-sided infrarenal inferior vena cava. 5. Aortic  atherosclerosis. Aortic Atherosclerosis (ICD10-I70.0). Electronically Signed   By: Aram Candela M.D.   On: 10/15/2022 01:45   DG Chest 2 View  Result Date: 10/14/2022 CLINICAL DATA:  Shortness of breath and chest pressure EXAM: CHEST - 2 VIEW COMPARISON:  Chest radiograph dated 01/20/2016 FINDINGS: Normal lung volumes. Bilateral lower lung linear opacities. No pleural effusion or pneumothorax. The heart size and mediastinal contours are within normal limits. Left clavicular fixation hardware appears intact. IMPRESSION: Bilateral lower lung linear opacities, likely atelectasis. Electronically Signed   By: Agustin Cree M.D.   On: 10/14/2022 21:35    Procedures Procedures  Cardiac monitor shows normal sinus rhythm, per my interpretation.  Medications Ordered in ED Medications - No data to display  ED Course/ Medical Decision Making/ A&P                                 Medical Decision Making Amount and/or Complexity of Data Reviewed Labs: ordered. Radiology: ordered.  Risk Prescription drug management.   Chest/abdominal pain, etiology unclear.  Differential diagnosis includes angina pectoris, GERD, peptic ulcer disease, pancreatitis, cholecystitis, diverticulitis.  No risk factors for pulmonary embolism, no need to screen with D-dimer.  I have reviewed his electrocardiogram and my interpretation is RSR prime in V1 which could represent incomplete right bundle branch block or right ventricular hypertrophy-unchanged from prior electrocardiogram.  Chest x-ray shows bibasilar atelectasis.  I have independently  viewed the images, and agree with the radiologist's interpretation.  I have reviewed his laboratory tests and my interpretation is normal troponin x 2, normal CBC, elevated random glucose level which will need to be followed as an outpatient.  Because symptoms actually seem more abdominal than chest, I have added hepatic function panel and lipase.  I have reviewed his old records and do  note CT of abdomen and pelvis on 06/15/2022 showing cholelithiasis.  Ultrasound is not immediately available, I have ordered repeat CT of abdomen and pelvis.  Cardiology office visit on 02/27/2022 mentions single episode of atrial flutter with CHA2DS2-VASc score of 0 managed with aspirin.  Nuclear stress test on 03/02/2016 was a low risk study.  Echocardiogram in 03/02/2016 showed possible ASD, normal left ventricular systolic function, grade 1 diastolic dysfunction.  TEE on 11/02/2016 showed normal LV function with normal LV diastolic function parameters, patent foramen ovale ovale with trivial bidirectional but predominantly left-to-right shunt.  I do not feel that this is significant related to his symptoms today.  I have ordered a dose of intravenous pantoprazole as well as morphine.  CT scan shows cholelithiasis without evidence of cholecystitis, proximal duodenitis which is probably what is causing his pain.  I have independently viewed the images, and agree with radiologist interpretation.  He feels much better following above-noted treatment, I feel he is safe for discharge.  I have told him to stop all NSAIDs as that is probably what has led to his current symptoms.  I am discharging him with a prescription for pantoprazole.  He will need to follow-up with his primary care provider for follow-up testing of liver enzymes, may need to consider gastroenterology consultation.  Final Clinical Impression(s) / ED Diagnoses Final diagnoses:  Duodenitis  Elevated liver function tests  Elevated random blood glucose level    Rx / DC Orders ED Discharge Orders          Ordered    pantoprazole (PROTONIX) 40 MG tablet  Daily        10/15/22 0212              Dione Booze, MD 10/15/22 [phone]

## 2022-10-15 NOTE — ED Notes (Signed)
Pt transported to imaging.

## 2022-10-20 DIAGNOSIS — K2980 Duodenitis without bleeding: Secondary | ICD-10-CM | POA: Diagnosis not present

## 2022-10-24 ENCOUNTER — Other Ambulatory Visit (HOSPITAL_BASED_OUTPATIENT_CLINIC_OR_DEPARTMENT_OTHER): Payer: Self-pay

## 2022-10-24 MED ORDER — INFLUENZA VIRUS VACC SPLIT PF (FLUZONE) 0.5 ML IM SUSY
0.5000 mL | PREFILLED_SYRINGE | Freq: Once | INTRAMUSCULAR | 0 refills | Status: AC
  Filled 2022-10-24: qty 0.5, 1d supply, fill #0

## 2022-11-14 DIAGNOSIS — M5416 Radiculopathy, lumbar region: Secondary | ICD-10-CM | POA: Diagnosis not present

## 2022-12-05 DIAGNOSIS — M5134 Other intervertebral disc degeneration, thoracic region: Secondary | ICD-10-CM | POA: Diagnosis not present

## 2022-12-05 DIAGNOSIS — Z6831 Body mass index (BMI) 31.0-31.9, adult: Secondary | ICD-10-CM | POA: Diagnosis not present

## 2022-12-06 DIAGNOSIS — Z125 Encounter for screening for malignant neoplasm of prostate: Secondary | ICD-10-CM | POA: Diagnosis not present

## 2022-12-06 DIAGNOSIS — E785 Hyperlipidemia, unspecified: Secondary | ICD-10-CM | POA: Diagnosis not present

## 2022-12-13 DIAGNOSIS — M5134 Other intervertebral disc degeneration, thoracic region: Secondary | ICD-10-CM | POA: Diagnosis not present

## 2022-12-13 DIAGNOSIS — Z0000 Encounter for general adult medical examination without abnormal findings: Secondary | ICD-10-CM | POA: Diagnosis not present

## 2022-12-13 DIAGNOSIS — Z1331 Encounter for screening for depression: Secondary | ICD-10-CM | POA: Diagnosis not present

## 2022-12-13 DIAGNOSIS — Z1339 Encounter for screening examination for other mental health and behavioral disorders: Secondary | ICD-10-CM | POA: Diagnosis not present

## 2022-12-13 DIAGNOSIS — R82998 Other abnormal findings in urine: Secondary | ICD-10-CM | POA: Diagnosis not present

## 2023-02-28 ENCOUNTER — Encounter: Payer: Self-pay | Admitting: Nurse Practitioner

## 2023-02-28 ENCOUNTER — Ambulatory Visit: Payer: BC Managed Care – PPO | Attending: Nurse Practitioner | Admitting: Nurse Practitioner

## 2023-02-28 VITALS — BP 126/84 | HR 75 | Ht 73.0 in | Wt 240.0 lb

## 2023-02-28 DIAGNOSIS — R931 Abnormal findings on diagnostic imaging of heart and coronary circulation: Secondary | ICD-10-CM

## 2023-02-28 DIAGNOSIS — I4510 Unspecified right bundle-branch block: Secondary | ICD-10-CM

## 2023-02-28 DIAGNOSIS — R400 Somnolence: Secondary | ICD-10-CM

## 2023-02-28 DIAGNOSIS — Q2112 Patent foramen ovale: Secondary | ICD-10-CM | POA: Diagnosis not present

## 2023-02-28 DIAGNOSIS — E785 Hyperlipidemia, unspecified: Secondary | ICD-10-CM

## 2023-02-28 DIAGNOSIS — Z8679 Personal history of other diseases of the circulatory system: Secondary | ICD-10-CM

## 2023-02-28 DIAGNOSIS — E669 Obesity, unspecified: Secondary | ICD-10-CM

## 2023-02-28 NOTE — Patient Instructions (Signed)
 Medication Instructions:  Your physician recommends that you continue on your current medications as directed. Please refer to the Current Medication list given to you today.  *If you need a refill on your cardiac medications before your next appointment, please call your pharmacy*   Lab Work: NONE ordered at this time of appointment     Testing/Procedures: NONE ordered at this time of appointment     Follow-Up: At [facility], you and your health needs are our priority.  As part of our continuing mission to provide you with exceptional heart care, we have created designated Provider Care Teams.  These Care Teams include your primary Cardiologist (physician) and Advanced Practice Providers (APPs -  Physician Assistants and Nurse Practitioners) who all work together to provide you with the care you need, when you need it.  We recommend signing up for the patient portal called "MyChart".  Sign up information is provided on this After Visit Summary.  MyChart is used to connect with patients for Virtual Visits (Telemedicine).  Patients are able to view lab/test results, encounter notes, upcoming appointments, etc.  Non-urgent messages can be sent to your provider as well.   To learn more about what you can do with MyChart, go to [email or web address].    Your next appointment:   1 year(s)  Provider:   Thurmon Fair, MD

## 2023-02-28 NOTE — Progress Notes (Signed)
 Office Visit    Patient Name: Jon Hurst Date of Encounter: 02/28/2023  Primary Care Provider:  Avva, Ravisankar, MD Primary Cardiologist:  Jerel Balding, MD  Chief Complaint    53 year old male with a history of mildly elevated coronary artery calcium score, paroxysmal atrial flutter, incomplete RBBB, PFO, hyperlipidemia, hypersomnolence, and obesity who presents for follow-up related to atrial flutter.  Past Medical History    Past Medical History:  Diagnosis Date   Paroxysmal atrial flutter (HCC)    PFO (patent foramen ovale) 11/02/2016   Past Surgical History:  Procedure Laterality Date   ANTERIOR CRUCIATE LIGAMENT REPAIR     SHOULDER ACROMIOPLASTY     TEE WITHOUT CARDIOVERSION N/A 11/02/2016   Procedure: TRANSESOPHAGEAL ECHOCARDIOGRAM (TEE);  Surgeon: Balding Jerel, MD;  Location: [facility] ENDOSCOPY;  Service: Cardiovascular;  Laterality: N/A;    Allergies  No Known Allergies   Labs/Other Studies Reviewed    The following studies were reviewed today:  Cardiac Studies & Procedures     STRESS TESTS  MYOCARDIAL PERFUSION IMAGING 03/02/2016  Narrative  The left ventricular ejection fraction is normal (55-65%).  Nuclear stress EF: 62%.  Horizontal ST segment depression ST segment depression of 0.5 mm was noted during stress in the V2, V3, V4, V5 and V6 leads.  The study is normal.  This is a low risk study.  Low risk stress nuclear study with normal perfusion and normal left ventricular regional and global systolic function. ECG changes are seen with exercise, likely represent a borderline false-positive response.  ECHOCARDIOGRAM  ECHOCARDIOGRAM COMPLETE 03/02/2016  Narrative *[facility]* [address] [place], [place] [address] [phone]  ------------------------------------------------------------------- Transthoracic Echocardiography  Patient:    Daryon, Remmert MR #:       [id number] Study Date: 03/02/2016 Gender:     M Age:         72 Height:     182.9 cm Weight:     103.9 kg BSA:        2.32 m^2 Pt. Status: Room:  SONOGRAPHER  Carl Plunk, RDCS ATTENDING    Jerel Balding, MD ORDERING     Jerel Balding, MD REFERRING    Jerel Balding, MD PERFORMING   [facility], Outpatient  cc:  ------------------------------------------------------------------- LV EF: 55% -   60%  ------------------------------------------------------------------- Indications:      R06.02 Shortness of Breath.  ------------------------------------------------------------------- History:   PMH:  Acquired from the patient and from the patient&'s chart.  ------------------------------------------------------------------- Study Conclusions  - Left ventricle: The cavity size was normal. Wall thickness was normal. Systolic function was normal. The estimated ejection fraction was in the range of 55% to 60%. Wall motion was normal; there were no regional wall motion abnormalities. Doppler parameters are consistent with abnormal left ventricular relaxation (grade 1 diastolic dysfunction). - Atrial septum: There was an atrial septal aneurysm.  Impressions:  - Normal LV systolic function; grade 1 diastolic dysfunction; trace MR and TR.  ------------------------------------------------------------------- Study data:   Study status:  Routine.  Procedure:  The patient reported no pain pre or post test. Transthoracic echocardiography for left ventricular function evaluation, for right ventricular function evaluation, and for assessment of valvular function. Image quality was adequate.  Study completion:  There were no complications.          Transthoracic echocardiography.  M-mode, complete 2D, spectral Doppler, and color Doppler.  Birthdate: Patient birthdate: 1970/05/18.  Age:  Patient is 53 yr old.  Sex: Gender: male.    BMI: 31.1 kg/m^2.  Blood pressure:     132/80 Patient status:  Outpatient.  Study date:  Study date:  03/02/2016. Study time: 03:55 PM.  Location:  [facility]  -------------------------------------------------------------------  ------------------------------------------------------------------- Left ventricle:  The cavity size was normal. Wall thickness was normal. Systolic function was normal. The estimated ejection fraction was in the range of 55% to 60%. Wall motion was normal; there were no regional wall motion abnormalities. Doppler parameters are consistent with abnormal left ventricular relaxation (grade 1 diastolic dysfunction).  ------------------------------------------------------------------- Aortic valve:   Trileaflet; mildly thickened leaflets. Mobility was not restricted.  Doppler:  Transvalvular velocity was within the normal range. There was no stenosis. There was no regurgitation.  ------------------------------------------------------------------- Aorta:  Aortic root: The aortic root was normal in size.  ------------------------------------------------------------------- Mitral valve:   Structurally normal valve.   Mobility was not restricted.  Doppler:  Transvalvular velocity was within the normal range. There was no evidence for stenosis. There was trivial regurgitation.  ------------------------------------------------------------------- Left atrium:  The atrium was normal in size.  ------------------------------------------------------------------- Atrial septum:  There was an atrial septal aneurysm.  ------------------------------------------------------------------- Right ventricle:  The cavity size was normal. Systolic function was normal.  ------------------------------------------------------------------- Pulmonic valve:    Doppler:  Transvalvular velocity was within the normal range. There was no evidence for stenosis. There was trivial regurgitation.  ------------------------------------------------------------------- Tricuspid valve:    Structurally normal valve.    Doppler: Transvalvular velocity was within the normal range. There was trivial regurgitation.  ------------------------------------------------------------------- Right atrium:  The atrium was normal in size.  ------------------------------------------------------------------- Pericardium:  There was no pericardial effusion.  ------------------------------------------------------------------- Systemic veins: Inferior vena cava: The vessel was normal in size.  ------------------------------------------------------------------- Measurements  Left ventricle                         Value        Reference LV ID, ED, PLAX chordal        (H)     52.3  mm     43 - 52 LV ID, ES, PLAX chordal                34.1  mm     23 - 38 LV fx shortening, PLAX chordal         35    %      >=29 LV PW thickness, ED                    10.99 mm     --------- IVS/LV PW ratio, ED                    0.92         <=1.3 Stroke volume, 2D                      71    ml     --------- Stroke volume/bsa, 2D                  31    ml/m^2 --------- LV e&', lateral                         9.43  cm/s   --------- LV E/e&', lateral                       6.43         --------- LV e&', medial  8.01  cm/s   --------- LV E/e&', medial                        7.57         --------- LV e&', average                         8.72  cm/s   --------- LV E/e&', average                       6.95         ---------  Ventricular septum                     Value        Reference IVS thickness, ED                      10.1  mm     ---------  LVOT                                   Value        Reference LVOT ID, S                             24    mm     --------- LVOT area                              4.52  cm^2   --------- LVOT ID                                24    mm     --------- LVOT peak velocity, S                  81.1  cm/s   --------- LVOT mean velocity, S                   57.6  cm/s   --------- LVOT VTI, S                            15.7  cm     --------- LVOT peak gradient, S                  3     mm Hg  --------- Stroke volume (SV), LVOT DP            71    ml     --------- Stroke index (SV/bsa), LVOT DP         30.6  ml/m^2 ---------  Aorta                                  Value        Reference Aortic root ID, ED                     35    mm     --------- Ascending aorta ID, A-P, S  27    mm     ---------  Left atrium                            Value        Reference LA ID, A-P, ES                         39    mm     --------- LA ID/bsa, A-P                         1.68  cm/m^2 <=2.2 LA volume, S                           51    ml     --------- LA volume/bsa, S                       21.9  ml/m^2 --------- LA volume, ES, 1-p A4C                 47    ml     --------- LA volume/bsa, ES, 1-p A4C             20.2  ml/m^2 --------- LA volume, ES, 1-p A2C                 55    ml     --------- LA volume/bsa, ES, 1-p A2C             23.7  ml/m^2 ---------  Mitral valve                           Value        Reference Mitral E-wave peak velocity            60.6  cm/s   --------- Mitral A-wave peak velocity            56.7  cm/s   --------- Mitral deceleration time               215   ms     150 - 230 Mitral E/A ratio, peak                 1.1          ---------  Tricuspid valve                        Value        Reference Tricuspid regurg peak velocity         201   cm/s   --------- Tricuspid peak RV-RA gradient          16    mm Hg  ---------  Right ventricle                        Value        Reference RV s&', lateral, S                      18.4  cm/s   ---------  Legend: (L)  and  (H)  mark values outside specified reference range.  ------------------------------------------------------------------- Prepared and Electronically Authenticated by  Redell Shallow 2018-02-08T16:49:08  TEE  ECHO TEE  11/02/2016  Narrative *[facility]* *[facility]* [address] [place], [place] [address] [phone]  ------------------------------------------------------------------- Transesophageal Echocardiography  Patient:    Wilmot, Quevedo MR #:       [id number] Study Date: 11/02/2016 Gender:     M Age:        46 Height:     182.9 cm Weight:     100 kg BSA:        2.28 m^2 Pt. Status: Room:  SONOGRAPHER  Koren Daring, RDCS ADMITTING    Jerel Balding, MD ATTENDING    Jerel Balding, MD ORDERING     Jerel Balding, MD PERFORMING   Jerel Balding, MD REFERRING    Jerel Balding, MD  cc:  ------------------------------------------------------------------- LV EF: 55% -   60%  ------------------------------------------------------------------- Indications:      Atrial flutter 427.32.  Patent foramen ovale 745.5.  ------------------------------------------------------------------- Study Conclusions  - Left ventricle: Systolic function was normal. The estimated ejection fraction was in the range of 55% to 60%. Wall motion was normal; there were no regional wall motion abnormalities. Left ventricular diastolic function parameters were normal. - Mitral valve: Trivial, late systolicprolapse, involving the posterior leaflet. - Left atrium: No evidence of thrombus in the atrial cavity or appendage. - Right atrium: No evidence of thrombus in the atrial cavity or appendage. - Atrial septum: There was a patent foramen ovale. Doppler showed a trivial bidirectional, but predominantly left-to-right, atrial level shunt, in the baseline state. - Pulmonic valve: No evidence of vegetation.  ------------------------------------------------------------------- Study data:   Study status:  Routine.  Consent:  The risks, benefits, and alternatives to the procedure were explained to the patient and informed consent was obtained.  Procedure:  Initial setup. The patient was brought to the laboratory.  Surface ECG leads were monitored. Sedation. Conscious sedation was administered. Transesophageal echocardiography. Topical anesthesia was obtained using viscous lidocaine . A transesophageal probe was inserted by the attending cardiologist. Image quality was adequate.  Study completion:  The patient tolerated the procedure well. There were no complications.  Administered medications:   Fentanyl , 75mcg, IV. Midazolam , 7mg , IV.          Diagnostic transesophageal echocardiography.  2D and color Doppler.  Birthdate:  Patient birthdate: 1970-08-28.  Age:  Patient is 53 yr old.  Sex:  Gender: male.    BMI: 29.9 kg/m^2.  Blood pressure:     129/83  Patient status:  Outpatient.  Study date:  Study date: 11/02/2016. Study time: 12:09 PM.  Location:  Endoscopy.  -------------------------------------------------------------------  ------------------------------------------------------------------- Left ventricle:  Systolic function was normal. The estimated ejection fraction was in the range of 55% to 60%. Wall motion was normal; there were no regional wall motion abnormalities. Left ventricular diastolic function parameters were normal.  ------------------------------------------------------------------- Aortic valve:   Structurally normal valve. Trileaflet; normal thickness leaflets. Cusp separation was normal.  Doppler:  There was no significant regurgitation.  ------------------------------------------------------------------- Aorta:  There was no atheroma. There was no evidence for dissection. Aortic root: The aortic root was not dilated. Ascending aorta: The ascending aorta was normal in size. Aortic arch: The aortic arch was normal in size. Descending aorta: The descending aorta was normal in size.  ------------------------------------------------------------------- Mitral valve:  Leaflet separation was normal.  Trivial, late systolicprolapse, involving the posterior leaflet.   Doppler:  There was trivial regurgitation.  ------------------------------------------------------------------- Left atrium:  The atrium was normal in size.  No evidence of thrombus in the atrial cavity or appendage. The appendage was morphologically a left  appendage, multilobulated, and of normal size. Emptying velocity was normal.  ------------------------------------------------------------------- Atrial septum:  There was a patent foramen ovale.  Doppler showed a trivial bidirectional, but predominantly left-to-right, atrial level shunt, in the baseline state.  ------------------------------------------------------------------- Right ventricle:  The cavity size was normal. Wall thickness was normal. Systolic function was normal.  ------------------------------------------------------------------- Pulmonic valve:    Structurally normal valve.   Cusp separation was normal.  No evidence of vegetation.  Doppler:  There was mild regurgitation.  ------------------------------------------------------------------- Tricuspid valve:   Structurally normal valve.   Leaflet separation was normal.  Doppler:  There was no significant regurgitation.  ------------------------------------------------------------------- Pulmonary artery:   The main pulmonary artery was normal-sized.  ------------------------------------------------------------------- Right atrium:  The atrium was normal in size.  No evidence of thrombus in the atrial cavity or appendage. The appendage was morphologically a right appendage.  ------------------------------------------------------------------- Pericardium:  There was no pericardial effusion.  ------------------------------------------------------------------- Prepared and Electronically Authenticated by  Jerel Balding, MD 2018-10-11T14:15:32           Recent Labs: 10/14/2022: ALT 305; BUN 12; Creatinine, Ser 0.83; Hemoglobin 13.6; Platelets 152; Potassium  3.6; Sodium 135  Recent Lipid Panel No results found for: CHOL, TRIG, HDL, CHOLHDL, VLDL, LDLCALC, LDLDIRECT  History of Present Illness    53 year old male with the above past medical history including mildly elevated coronary artery calcium score, paroxysmal atrial flutter, incomplete RBBB, PFO, hyperlipidemia, hypersomnolence, and obesity.  He has a history of a single episode of atrial flutter that occurred in December 2018 and resolved spontaneously.  Echocardiogram showed normal right and left heart chamber sizes, normal right and left ventricle systolic function, without wall hypertrophy or valvular abnormalities.  He was noted to have an atrial septal aneurysm, patent foramen ovale with bidirectional shunt, without signs of right heart overload.  TEE showed evidence of a small patent foramen ovale with bidirectional shunt, EF 55 to 60%.  ETT in 2018 showed good performance, hypertensive response to exercise. He has remained stable from a cardiac standpoint. He was last seen in office on 02/27/2022 and was doing well.  He did note hypersomnolence, snoring. Outpatient sleep study was recommended.  He presents today for follow-up.  Since his last visit he has done well from a cardiac standpoint.  He denies any symptoms concerning for angina, denies palpitations, dizziness, dyspnea, edema, PND, orthopnea, weight gain.  He notes that he did complete a sleep study last year, he was told that the results were pending but never heard anything in follow-up.  Overall, he reports feeling well.  Home Medications    Current Outpatient Medications  Medication Sig Dispense Refill   pantoprazole  (PROTONIX ) 40 MG tablet Take 1 tablet (40 mg total) by mouth daily. 30 tablet 0   rosuvastatin (CRESTOR) 10 MG tablet Take 10 mg by mouth daily.     No current facility-administered medications for this visit.     Review of Systems    He denies chest pain, palpitations, dyspnea, pnd, orthopnea, n,  v, dizziness, syncope, edema, weight gain, or early satiety. All other systems reviewed and are otherwise negative except as noted above.   Physical Exam    VS:  BP 126/84   Pulse 75   Ht 6' 1 (1.854 m)   Wt 240 lb (108.9 kg)   SpO2 95%   BMI 31.66 kg/m   STOP-Bang Score:         GEN: Well nourished, well developed, in no acute distress. HEENT: normal. Neck: Supple, no JVD,  carotid bruits, or masses. Cardiac: RRR, no murmurs, rubs, or gallops. No clubbing, cyanosis, edema.  Radials/DP/PT 2+ and equal bilaterally.  Respiratory:  Respirations regular and unlabored, clear to auscultation bilaterally. GI: Soft, nontender, nondistended, BS + x 4. MS: no deformity or atrophy. Skin: warm and dry, no rash. Neuro:  Strength and sensation are intact. Psych: Normal affect.  Accessory Clinical Findings    ECG personally reviewed by me today -    - no EKG in office today.    Lab Results  Component Value Date   WBC 4.9 10/14/2022   HGB 13.6 10/14/2022   HCT 39.9 10/14/2022   MCV 83.3 10/14/2022   PLT 152 10/14/2022   Lab Results  Component Value Date   CREATININE 0.83 10/14/2022   BUN 12 10/14/2022   NA 135 10/14/2022   K 3.6 10/14/2022   CL 99 10/14/2022   CO2 25 10/14/2022   Lab Results  Component Value Date   ALT 305 (H) 10/14/2022   AST 227 (H) 10/14/2022   ALKPHOS 250 (H) 10/14/2022   BILITOT 3.0 (H) 10/14/2022   No results found for: CHOL, HDL, LDLCALC, LDLDIRECT, TRIG, CHOLHDL  No results found for: HGBA1C  Assessment & Plan    1. Elevated coronary calcium score: Coronary calcium score in 2023 was 44.2 (74th percentile). Stable with no anginal symptoms. No indication for ischemic evaluation.  Continue Crestor.  2. Paroxysmal atrial flutter: Isolated episode in 2018, no recurrence.  Not on anticoagulation.  3. Incomplete RBBB: Asymptomatic, possibly in the setting of undiagnosed sleep apnea.  4. PFO: No indication for treatment at this time.  5.  Hyperlipidemia: No recent LDL on file.  Will request most recent labs from PCP.  Continue Crestor.  6. Obesity: Continue to work on lifestyle modifications with diet and exercise.   7. Hypersomnolence: He completed a sleep study in 04/2022. He was told that the results were pending but never heard anything in follow-up.  We will check on the status of this.  No longer reports symptoms of hypersomnolence.  8. Disposition: Follow-up in 1 year.      Damien JAYSON Braver, NP 03/01/2023, 4:03 PM

## 2023-03-01 ENCOUNTER — Encounter: Payer: Self-pay | Admitting: Nurse Practitioner

## 2023-06-12 DIAGNOSIS — M5134 Other intervertebral disc degeneration, thoracic region: Secondary | ICD-10-CM | POA: Diagnosis not present

## 2023-07-11 ENCOUNTER — Inpatient Hospital Stay (HOSPITAL_BASED_OUTPATIENT_CLINIC_OR_DEPARTMENT_OTHER)
Admission: EM | Admit: 2023-07-11 | Discharge: 2023-07-15 | DRG: 417 | Disposition: A | Discharge status: Home / Self Care | Attending: Internal Medicine | Admitting: Internal Medicine

## 2023-07-11 ENCOUNTER — Other Ambulatory Visit: Payer: Self-pay

## 2023-07-11 ENCOUNTER — Emergency Department (HOSPITAL_BASED_OUTPATIENT_CLINIC_OR_DEPARTMENT_OTHER)

## 2023-07-11 ENCOUNTER — Encounter (HOSPITAL_BASED_OUTPATIENT_CLINIC_OR_DEPARTMENT_OTHER): Payer: Self-pay

## 2023-07-11 DIAGNOSIS — K851 Biliary acute pancreatitis without necrosis or infection: Principal | ICD-10-CM | POA: Diagnosis present

## 2023-07-11 DIAGNOSIS — R0789 Other chest pain: Secondary | ICD-10-CM | POA: Diagnosis not present

## 2023-07-11 DIAGNOSIS — Z79899 Other long term (current) drug therapy: Secondary | ICD-10-CM

## 2023-07-11 DIAGNOSIS — Z87891 Personal history of nicotine dependence: Secondary | ICD-10-CM

## 2023-07-11 DIAGNOSIS — Z6831 Body mass index (BMI) 31.0-31.9, adult: Secondary | ICD-10-CM

## 2023-07-11 DIAGNOSIS — Z83719 Family history of colon polyps, unspecified: Secondary | ICD-10-CM

## 2023-07-11 DIAGNOSIS — R748 Abnormal levels of other serum enzymes: Secondary | ICD-10-CM | POA: Diagnosis not present

## 2023-07-11 DIAGNOSIS — Z8679 Personal history of other diseases of the circulatory system: Secondary | ICD-10-CM

## 2023-07-11 DIAGNOSIS — K859 Acute pancreatitis without necrosis or infection, unspecified: Principal | ICD-10-CM

## 2023-07-11 DIAGNOSIS — K8064 Calculus of gallbladder and bile duct with chronic cholecystitis without obstruction: Secondary | ICD-10-CM | POA: Diagnosis not present

## 2023-07-11 DIAGNOSIS — R0602 Shortness of breath: Secondary | ICD-10-CM | POA: Diagnosis not present

## 2023-07-11 DIAGNOSIS — E669 Obesity, unspecified: Secondary | ICD-10-CM | POA: Diagnosis present

## 2023-07-11 DIAGNOSIS — Z825 Family history of asthma and other chronic lower respiratory diseases: Secondary | ICD-10-CM

## 2023-07-11 DIAGNOSIS — K8020 Calculus of gallbladder without cholecystitis without obstruction: Secondary | ICD-10-CM | POA: Diagnosis not present

## 2023-07-11 DIAGNOSIS — R932 Abnormal findings on diagnostic imaging of liver and biliary tract: Secondary | ICD-10-CM | POA: Diagnosis not present

## 2023-07-11 DIAGNOSIS — Z8249 Family history of ischemic heart disease and other diseases of the circulatory system: Secondary | ICD-10-CM | POA: Diagnosis not present

## 2023-07-11 DIAGNOSIS — R7989 Other specified abnormal findings of blood chemistry: Secondary | ICD-10-CM | POA: Diagnosis not present

## 2023-07-11 DIAGNOSIS — K760 Fatty (change of) liver, not elsewhere classified: Secondary | ICD-10-CM | POA: Diagnosis present

## 2023-07-11 DIAGNOSIS — K8050 Calculus of bile duct without cholangitis or cholecystitis without obstruction: Secondary | ICD-10-CM

## 2023-07-11 DIAGNOSIS — I4892 Unspecified atrial flutter: Secondary | ICD-10-CM | POA: Diagnosis present

## 2023-07-11 DIAGNOSIS — Q2112 Patent foramen ovale: Secondary | ICD-10-CM

## 2023-07-11 DIAGNOSIS — K5710 Diverticulosis of small intestine without perforation or abscess without bleeding: Secondary | ICD-10-CM | POA: Diagnosis not present

## 2023-07-11 DIAGNOSIS — K838 Other specified diseases of biliary tract: Secondary | ICD-10-CM | POA: Diagnosis not present

## 2023-07-11 DIAGNOSIS — E7800 Pure hypercholesterolemia, unspecified: Secondary | ICD-10-CM | POA: Diagnosis present

## 2023-07-11 DIAGNOSIS — K219 Gastro-esophageal reflux disease without esophagitis: Secondary | ICD-10-CM | POA: Diagnosis present

## 2023-07-11 DIAGNOSIS — K8044 Calculus of bile duct with chronic cholecystitis without obstruction: Secondary | ICD-10-CM | POA: Diagnosis not present

## 2023-07-11 DIAGNOSIS — K8010 Calculus of gallbladder with chronic cholecystitis without obstruction: Secondary | ICD-10-CM | POA: Diagnosis not present

## 2023-07-11 DIAGNOSIS — R109 Unspecified abdominal pain: Secondary | ICD-10-CM | POA: Diagnosis not present

## 2023-07-11 DIAGNOSIS — K8051 Calculus of bile duct without cholangitis or cholecystitis with obstruction: Secondary | ICD-10-CM | POA: Diagnosis not present

## 2023-07-11 DIAGNOSIS — K828 Other specified diseases of gallbladder: Secondary | ICD-10-CM | POA: Diagnosis not present

## 2023-07-11 DIAGNOSIS — R1013 Epigastric pain: Secondary | ICD-10-CM | POA: Diagnosis not present

## 2023-07-11 DIAGNOSIS — R079 Chest pain, unspecified: Secondary | ICD-10-CM | POA: Diagnosis not present

## 2023-07-11 DIAGNOSIS — K8070 Calculus of gallbladder and bile duct without cholecystitis without obstruction: Secondary | ICD-10-CM | POA: Diagnosis not present

## 2023-07-11 DIAGNOSIS — Z800 Family history of malignant neoplasm of digestive organs: Secondary | ICD-10-CM

## 2023-07-11 LAB — BASIC METABOLIC PANEL WITH GFR
Anion gap: 12 (ref 5–15)
BUN: 10 mg/dL (ref 6–20)
CO2: 25 mmol/L (ref 22–32)
Calcium: 9.7 mg/dL (ref 8.9–10.3)
Chloride: 103 mmol/L (ref 98–111)
Creatinine, Ser: 1.01 mg/dL (ref 0.61–1.24)
GFR, Estimated: >60 mL/min (ref >60)
Glucose, Bld: 129 mg/dL — ABNORMAL HIGH (ref 70–99)
Potassium: 3.8 mmol/L (ref 3.5–5.1)
Sodium: 139 mmol/L (ref 135–145)

## 2023-07-11 LAB — HEPATIC FUNCTION PANEL
ALT: 302 U/L — ABNORMAL HIGH (ref 0–44)
AST: 299 U/L — ABNORMAL HIGH (ref 15–41)
Albumin: 4.3 g/dL (ref 3.5–5.0)
Alkaline Phosphatase: 187 U/L — ABNORMAL HIGH (ref 38–126)
Bilirubin, Direct: 1.1 mg/dL — ABNORMAL HIGH (ref 0.0–0.2)
Indirect Bilirubin: 0.5 mg/dL (ref 0.3–0.9)
Total Bilirubin: 1.6 mg/dL — ABNORMAL HIGH (ref 0.0–1.2)
Total Protein: 7.3 g/dL (ref 6.5–8.1)

## 2023-07-11 LAB — CBC
HCT: 43.8 % (ref 39.0–52.0)
Hemoglobin: 15 g/dL (ref 13.0–17.0)
MCH: 27.8 pg (ref 26.0–34.0)
MCHC: 34.2 g/dL (ref 30.0–36.0)
MCV: 81.3 fL (ref 80.0–100.0)
Platelets: 185 10*3/uL (ref 150–400)
RBC: 5.39 MIL/uL (ref 4.22–5.81)
RDW: 13.8 % (ref 11.5–15.5)
WBC: 10 10*3/uL (ref 4.0–10.5)
nRBC: 0 % (ref 0.0–0.2)

## 2023-07-11 LAB — TROPONIN T, HIGH SENSITIVITY
Troponin T High Sensitivity: <15 ng/L (ref <19)
Troponin T High Sensitivity: <15 ng/L (ref <19)

## 2023-07-11 LAB — LIPASE, BLOOD: Lipase: >2800 U/L — ABNORMAL HIGH (ref 11–51)

## 2023-07-11 MED ORDER — PANTOPRAZOLE SODIUM 40 MG IV SOLR
40.0000 mg | Freq: Once | INTRAVENOUS | Status: AC
  Administered 2023-07-11: 40 mg via INTRAVENOUS
  Filled 2023-07-11: qty 10

## 2023-07-11 MED ORDER — ONDANSETRON HCL 4 MG/2ML IJ SOLN: 4.0000 mg | Freq: Four times a day (QID) | INTRAMUSCULAR | Status: DC | PRN

## 2023-07-11 MED ORDER — ONDANSETRON HCL 4 MG/2ML IJ SOLN
4.0000 mg | Freq: Once | INTRAMUSCULAR | Status: AC
  Administered 2023-07-11: 4 mg via INTRAVENOUS
  Filled 2023-07-11: qty 2

## 2023-07-11 MED ORDER — MORPHINE SULFATE (PF) 2 MG/ML IV SOLN
2.0000 mg | INTRAVENOUS | Status: DC | PRN
  Administered 2023-07-12 – 2023-07-14 (×2): 2 mg via INTRAVENOUS
  Filled 2023-07-11 (×3): qty 1

## 2023-07-11 MED ORDER — LACTATED RINGERS IV SOLN: INTRAVENOUS | Status: AC

## 2023-07-11 MED ORDER — ALUM & MAG HYDROXIDE-SIMETH 200-200-20 MG/5ML PO SUSP
30.0000 mL | Freq: Once | ORAL | Status: AC
  Administered 2023-07-11: 30 mL via ORAL
  Filled 2023-07-11: qty 30

## 2023-07-11 MED ORDER — IOHEXOL 300 MG/ML  SOLN
100.0000 mL | Freq: Once | INTRAMUSCULAR | Status: AC | PRN
  Administered 2023-07-11: 125 mL via INTRAVENOUS

## 2023-07-11 MED ORDER — HYDROMORPHONE HCL 1 MG/ML IJ SOLN
0.5000 mg | Freq: Once | INTRAMUSCULAR | Status: AC
  Administered 2023-07-11: 0.5 mg via INTRAVENOUS
  Filled 2023-07-11: qty 1

## 2023-07-11 NOTE — ED Provider Notes (Signed)
 Emergency Department Provider Note   I have reviewed the triage vital signs and the nursing notes.   HISTORY  Chief Complaint Chest Pain   HPI Jon Hurst is a 53 y.o. male with past history reviewed below presents emergency department with midepigastric pain and nausea.  There is some radiation into the chest but also in the left upper abdomen.  Symptoms been ongoing for the past couple of days.  Some nausea but no vomiting.  No diarrhea.  No fevers.  No symptoms with exertion.  No pleuritic pain.  No history of similar in the past.   Past Medical History:  Diagnosis Date   Paroxysmal atrial flutter (HCC)    PFO (patent foramen ovale) 11/02/2016    Review of Systems  Constitutional: No fever/chills Cardiovascular: Positive chest pain. Respiratory: Denies shortness of breath. Gastrointestinal: Positive epigastric abdominal pain. Positive nausea, no vomiting.  No diarrhea.  No constipation. Skin: Negative for rash. Neurological: Negative for headaches.  ____________________________________________   PHYSICAL EXAM:  VITAL SIGNS: ED Triage Vitals  Encounter Vitals Group     BP 07/11/23 1134 (!) 155/104     Pulse Rate 07/11/23 1134 83     Resp 07/11/23 1134 18     Temp 07/11/23 1134 98.2 F (36.8 C)     Temp src --      SpO2 07/11/23 1134 98 %     Weight 07/11/23 1135 240 lb (108.9 kg)     Height 07/11/23 1135 6' 1 (1.854 m)   Constitutional: Alert and oriented. Well appearing and in no acute distress. Eyes: Conjunctivae are normal.  Head: Atraumatic. Nose: No congestion/rhinnorhea. Neck: No stridor.   Cardiovascular: Normal rate, regular rhythm. Good peripheral circulation. Grossly normal heart sounds.   Respiratory: Normal respiratory effort.  No retractions. Lungs CTAB. Gastrointestinal: Soft with midepigastric and left upper quadrant abdominal tenderness.  No peritonitis.  Nontender lower abdomen.  No distention.  Musculoskeletal: No lower extremity  tenderness nor edema. No gross deformities of extremities. Neurologic:  Normal speech and language. No gross focal neurologic deficits are appreciated.  Skin:  Skin is warm, dry and intact. No rash noted.   ____________________________________________   LABS (all labs ordered are listed, but only abnormal results are displayed)  Labs Reviewed  BASIC METABOLIC PANEL WITH GFR - Abnormal; Notable for the following components:      Result Value   Glucose, Bld 129 (*)    All other components within normal limits  HEPATIC FUNCTION PANEL - Abnormal; Notable for the following components:   AST 299 (*)    ALT 302 (*)    Alkaline Phosphatase 187 (*)    Total Bilirubin 1.6 (*)    Bilirubin, Direct 1.1 (*)    All other components within normal limits  LIPASE, BLOOD - Abnormal; Notable for the following components:   Lipase >2,800 (*)    All other components within normal limits  COMPREHENSIVE METABOLIC PANEL WITH GFR - Abnormal; Notable for the following components:   Glucose, Bld 136 (*)    AST 258 (*)    ALT 463 (*)    Alkaline Phosphatase 166 (*)    Total Bilirubin 1.4 (*)    All other components within normal limits  CBC - Abnormal; Notable for the following components:   Platelets 148 (*)    All other components within normal limits  COMPREHENSIVE METABOLIC PANEL WITH GFR - Abnormal; Notable for the following components:   Glucose, Bld 123 (*)  Calcium 8.6 (*)    AST 204 (*)    ALT 356 (*)    Alkaline Phosphatase 179 (*)    Total Bilirubin 1.6 (*)    All other components within normal limits  LIPASE, BLOOD - Abnormal; Notable for the following components:   Lipase 139 (*)    All other components within normal limits  COMPREHENSIVE METABOLIC PANEL WITH GFR - Abnormal; Notable for the following components:   Glucose, Bld 130 (*)    AST 83 (*)    ALT 266 (*)    Alkaline Phosphatase 175 (*)    All other components within normal limits  CBC - Abnormal; Notable for the  following components:   Platelets 139 (*)    All other components within normal limits  CBC WITH DIFFERENTIAL/PLATELET - Abnormal; Notable for the following components:   WBC 12.6 (*)    Neutro Abs 9.8 (*)    All other components within normal limits  COMPREHENSIVE METABOLIC PANEL WITH GFR - Abnormal; Notable for the following components:   Glucose, Bld 109 (*)    AST 49 (*)    ALT 194 (*)    Alkaline Phosphatase 148 (*)    All other components within normal limits  CBC  HIV ANTIBODY (ROUTINE TESTING W REFLEX)  CBC  LIPASE, BLOOD  SURGICAL PATHOLOGY  TROPONIN T, HIGH SENSITIVITY  TROPONIN T, HIGH SENSITIVITY   ____________________________________________  EKG   EKG Interpretation Date/Time:  Wednesday July 11 2023 11:38:06 EDT Ventricular Rate:  76 PR Interval:  134 QRS Duration:  105 QT Interval:  424 QTC Calculation: 477 R Axis:   20  Text Interpretation: Sinus rhythm Abnormal T, consider ischemia, anterior leads Confirmed by Darra Chew [phone]) on 07/11/2023 11:50:17 AM       ____________________________________________   PROCEDURES  Procedure(s) performed:   Procedures  None  ____________________________________________   INITIAL IMPRESSION / ASSESSMENT AND PLAN / ED COURSE  Pertinent labs & imaging results that were available during my care of the patient were reviewed by me and considered in my medical decision making (see chart for details).   This patient is Presenting for Evaluation of CP, which does require a range of treatment options, and is a complaint that involves a high risk of morbidity and mortality.  The Differential Diagnoses  includes but is not exclusive to acute coronary syndrome, aortic dissection, pulmonary embolism, cardiac tamponade, community-acquired pneumonia, pericarditis, musculoskeletal chest wall pain, etc.   Critical Interventions-    Medications  lactated ringers  infusion (0 mLs Intravenous Stopped 07/12/23 0830)   dextrose  5 % in lactated ringers  infusion (0 mLs Intravenous Stopped 07/13/23 2334)  pantoprazole  (PROTONIX ) injection 40 mg (40 mg Intravenous Given 07/11/23 1306)  alum & mag hydroxide-simeth (MAALOX/MYLANTA) 200-200-20 MG/5ML suspension 30 mL (30 mLs Oral Given 07/11/23 1307)  iohexol  (OMNIPAQUE ) 300 MG/ML solution 100 mL (125 mLs Intravenous Contrast Given 07/11/23 1421)  ondansetron  (ZOFRAN ) injection 4 mg (4 mg Intravenous Given 07/11/23 1357)  HYDROmorphone  (DILAUDID ) injection 0.5 mg (0.5 mg Intravenous Given 07/11/23 1744)  ondansetron  (ZOFRAN ) injection 4 mg (4 mg Intravenous Given 07/11/23 1742)  gadobutrol  (GADAVIST ) 1 MMOL/ML injection 10 mL (10 mLs Intravenous Contrast Given 07/12/23 1152)  ceFAZolin  (ANCEF ) IVPB 2g/100 mL premix (2 g Intravenous Given 07/14/23 0857)  lactated ringers  infusion (0 mLs  Stopped 07/13/23 1525)  lactated ringers  infusion (1 Application  New Bag/Given 07/14/23 0926)    Reassessment after intervention: pain improved.    I did obtain Additional Historical Information from  wife at bedside.    Clinical Laboratory Tests Ordered, included lipase greater than 2800.  Troponin normal.  Bilirubin minimally elevated at 1.1 with elevated LFTs.  Radiologic Tests Ordered, included CT abdomen/pelvis and CXR. I independently interpreted the images and agree with radiology interpretation.   Cardiac Monitor Tracing which shows NSR.    Social Determinants of Health Risk patient is a non-smoker. Does not drink.   Medical Decision Making: Summary:  Patient with epigastric pain and significantly elevated lipase. CP likely non-cardiac. Plan for RUQ US  and reassess.   Reevaluation with update and discussion with patient. Care transferred to Dr. Mannie pending US  results and likely admit.   Patient's presentation is most consistent with acute presentation with potential threat to life or bodily function.   Disposition:  admit  ____________________________________________  FINAL CLINICAL IMPRESSION(S) / ED DIAGNOSES  Final diagnoses:  Acute pancreatitis, unspecified complication status, unspecified pancreatitis type    Note:  This document was prepared using Dragon voice recognition software and may include unintentional dictation errors.  Fonda Law, MD, [facility] Emergency Medicine    Maija Biggers, Fonda MATSU, MD 07/20/23 [phone]

## 2023-07-11 NOTE — ED Notes (Signed)
 ED Provider at bedside.

## 2023-07-11 NOTE — ED Provider Notes (Signed)
  Physical Exam  BP (!) 138/92   Pulse 69   Temp 98.2 F (36.8 C)   Resp 17   Ht 6' 1 (1.854 m)   Wt 108.9 kg   SpO2 97%   BMI 31.66 kg/m   Physical Exam  Procedures  Procedures  ED Course / MDM   Clinical Course as of 07/11/23 1707  Wed Jul 11, 2023  1329 Lipase, blood(!) [JL]    Clinical Course User Index [JL] Long, Shereen Dike, MD   Medical Decision Making Amount and/or Complexity of Data Reviewed Labs: ordered. Decision-making details documented in ED Course. Radiology: ordered.  Risk OTC drugs. Prescription drug management.   Melody Spurling, have assumed care for this patient.  In brief, 53 year old male here today with pain in his epigastrium region.  Patient with what appears to be gallstone pancreatitis.  CBD is mildly elevated at 8 mm.  LFTs elevated.  We have a new workflow for patient's with equivocal choledocholithiasis.  Have placed consult to hospitalist.  Will reach out to GI and general surgery if they requested       Afton Horse T, DO 07/12/23 1507

## 2023-07-11 NOTE — ED Triage Notes (Signed)
 C/o mid sternal chest pain since Monday morning with shortness of breath.

## 2023-07-11 NOTE — H&P (Signed)
 History and Physical    Jon Hurst:[id number] DOB: 07/10/1970 DOA: 07/11/2023  PCP: Avva, Ravisankar, MD  Patient coming from: Home  I have personally briefly reviewed patient's old medical records in [facility]  Chief Complaint: Epigastric pain  HPI: Jon Hurst is a 53 y.o. male with medical history significant for isolated episode of atrial flutter in 2018 not on anticoagulation, HLD who presented to the ED for evaluation of epigastric pain.  Patient reports about 1 week ago developing intermittent epigastric pain.  He noticed pain would occur shortly after mealtime.  Over the last week symptoms have been more frequent and today became very severe while he was at work.  He noticed pain with worsening with deep inspiration as well.  He denies fevers, chills, diaphoresis.  He went to the ED for further evaluation.  [facility] ED Course  Labs/Imaging on admission: I have personally reviewed following labs and imaging studies.  Initial vitals showed BP 155/104, pulse 77, RR 18, temp 98.2 F, SpO2 98% on room air.  Labs show lipase >2800, AST 299, ALT 302, alk phos 187, total bilirubin 1.6, WBC 10.0, hemoglobin 15.0, platelets 185, sodium 139, potassium 3.8, bicarb 25, BUN 10, creatinine 1.01, serum glucose 129.  Troponin T negative x 2.  2 view chest x-ray negative for acute intrathoracic process.  CT abdomen/pelvis with contrast negative for acute process.  Cholelithiasis and sigmoid diverticulosis noted.  RUQ ultrasound showed dilated gallbladder with slight ectasia of the common duct, some sludge identified.  Fatty infiltration noted.  Patient was given IV Dilaudid, Zofran, Protonix . The hospitalist service was consulted to admit.  Review of Systems: All systems reviewed and are negative except as documented in history of present illness above.   Past Medical History:  Diagnosis Date   Paroxysmal atrial flutter (HCC)    PFO (patent foramen ovale)  11/02/2016    Past Surgical History:  Procedure Laterality Date   ANTERIOR CRUCIATE LIGAMENT REPAIR     SHOULDER ACROMIOPLASTY     TEE WITHOUT CARDIOVERSION N/A 11/02/2016   Procedure: TRANSESOPHAGEAL ECHOCARDIOGRAM (TEE);  Surgeon: Luana Rumple, MD;  Location: [facility] ENDOSCOPY;  Service: Cardiovascular;  Laterality: N/A;    Social History: Patient reports occasional alcohol  use on social occasions.  No Known Allergies  Family History  Problem Relation Age of Onset   Heart failure Mother    Colon polyps Father    COPD Father    Colon cancer Father    Esophageal cancer Neg Hx    Stomach cancer Neg Hx    Rectal cancer Neg Hx      Prior to Admission medications   Medication Sig Start Date End Date Taking? Authorizing Provider  Misc Natural Products (BEET ROOT PO) Take 1 tablet by mouth daily.   Yes [provider]  pantoprazole  (PROTONIX ) 40 MG tablet Take 1 tablet (40 mg total) by mouth daily. 10/15/22  Yes Alissa April, MD  rosuvastatin (CRESTOR) 10 MG tablet Take 10 mg by mouth daily. Patient not taking: Reported on 07/11/2023    [provider]    Physical Exam: Vitals:   07/11/23 1530 07/11/23 1742 07/11/23 1745 07/11/23 1947  BP: (!) 148/93 (!) 144/90  (!) 146/94  Pulse: 79 77  74  Resp: 16 15  18   Temp:   98.4 F (36.9 C) 98.7 F (37.1 C)  TempSrc:   Oral   SpO2: 96% 97%  97%  Weight:      Height:  Constitutional: Resting in bed with head elevated, NAD, calm, comfortable Eyes: EOMI, lids and conjunctivae normal ENMT: Mucous membranes are moist. Posterior pharynx clear of any exudate or lesions.Normal dentition.  Neck: normal, supple, no masses. Respiratory: clear to auscultation bilaterally, no wheezing, no crackles. Normal respiratory effort. No accessory muscle use.  Cardiovascular: Regular rate and rhythm, no murmurs / rubs / gallops. No extremity edema. 2+ pedal pulses. Abdomen: Epigastric tenderness, no masses  palpated. Musculoskeletal: no clubbing / cyanosis. No joint deformity upper and lower extremities. Good ROM, no contractures. Normal muscle tone.  Skin: no rashes, lesions, ulcers. No induration Neurologic: Sensation intact. Strength 5/5 in all 4.  Psychiatric: Normal judgment and insight. Alert and oriented x 3. Normal mood.   EKG: Personally reviewed. Sinus rhythm, rate 76, minimal ST depression throughout, similar to prior.  Assessment/Plan Principal Problem:   Gallstone pancreatitis Active Problems:   Hypercholesterolemia   History of atrial flutter   Jon Hurst is a 53 y.o. male with medical history significant for isolated episode of atrial flutter in 2018 not on anticoagulation, HLD who is admitted with acute gallstone pancreatitis.  Assessment and Plan: Acute gallstone pancreatitis: Presenting with epigastric pain, elevated LFTs and lipase, equivocal CT and ultrasound imaging.  Suspicion is for gallstone pancreatitis due to choledocholithiasis. - Obtain MRCP - Keep n.p.o., continue IV fluids overnight - IV analgesics and antiemetics as needed  Hyperlipidemia: Holding statin.  History of isolated atrial flutter: Follows with cardiology.  Per their documentation patient had isolated atrial flutter in 2018 without recurrence.  Not on chronic anticoagulation.    DVT prophylaxis: SCDs Start: 07/11/23 2052 Code Status: Full code, confirmed with patient on admission Family Communication: Spouse at bedside Disposition Plan: From home, dispo pending clinical progress Consults called: Pending MRCP Severity of Illness: The appropriate patient status for this patient is INPATIENT. Inpatient status is judged to be reasonable and necessary in order to provide the required intensity of service to ensure the patient's safety. [name] presenting symptoms, physical exam findings, and initial radiographic and laboratory data in the context of their chronic comorbidities is felt  to place them at high risk for further clinical deterioration. Furthermore, it is not anticipated that the patient will be medically stable for discharge from the hospital within 2 midnights of admission.   * I certify that at the point of admission it is my clinical judgment that the patient will require inpatient hospital care spanning beyond 2 midnights from the point of admission due to high intensity of service, high risk for further deterioration and high frequency of surveillance required.Edith Gores MD [facility]  If 7PM-7AM, please contact night-coverage [email or web address]  07/11/2023, 8:56 PM

## 2023-07-11 NOTE — ED Notes (Signed)
 Attempted to call Floor RN.  No answer.  Left name and number for call back

## 2023-07-11 NOTE — ED Notes (Signed)
 ED TO INPATIENT HANDOFF REPORT  ED Nurse Name and Phone #: Linwood Ricker, RN  [phone]  S Name/Age/Gender Jon Hurst 53 y.o. male Room/Bed: MHOTF/OTF  Code Status   Code Status: Not on file  Home/SNF/Other Home Patient oriented to: self, place, time, and situation Is this baseline? Yes   Triage Complete: Triage complete  Chief Complaint Gallstone pancreatitis [K85.10]  Triage Note C/o mid sternal chest pain since Monday morning with shortness of breath.    Allergies No Known Allergies  Level of Care/Admitting Diagnosis ED Disposition     ED Disposition  Admit   Condition  --   Comment  H[facility]: [facility] [[id number]]  Level of Care: Med-Surg [16]  May admit patient to [facility] or [facility] if equivalent level of care is available:: Yes  Interfacility transfer: Yes  Covid Evaluation: Asymptomatic - no recent exposure (last 10 days) testing not required  Diagnosis: Gallstone pancreatitis [562130]  Admitting Physician: DIBIA, PAULINE E [[id number]]  Attending Physician: DIBIA, PAULINE E [[id number]]  Certification:: I certify this patient will need inpatient services for at least 2 midnights  Expected Medical Readiness: 07/13/2023          B Medical/Surgery History Past Medical History:  Diagnosis Date   Paroxysmal atrial flutter (HCC)    PFO (patent foramen ovale) 11/02/2016   Past Surgical History:  Procedure Laterality Date   ANTERIOR CRUCIATE LIGAMENT REPAIR     SHOULDER ACROMIOPLASTY     TEE WITHOUT CARDIOVERSION N/A 11/02/2016   Procedure: TRANSESOPHAGEAL ECHOCARDIOGRAM (TEE);  Surgeon: Luana Rumple, MD;  Location: [facility] ENDOSCOPY;  Service: Cardiovascular;  Laterality: N/A;     A IV Location/Drains/Wounds Patient Lines/Drains/Airways Status     Active Line/Drains/Airways     Name Placement date Placement time Site Days   Peripheral IV 07/11/23 20 G 1 Anterior;Distal;Right;Upper Arm 07/11/23  1213  Arm  less  than 1            Intake/Output Last 24 hours No intake or output data in the 24 hours ending 07/11/23 1843  Labs/Imaging Results for orders placed or performed during the hospital encounter of 07/11/23 (from the past 48 hours)  Basic metabolic panel     Status: Abnormal   Collection Time: 07/11/23 12:07 PM  Result Value Ref Range   Sodium 139 135 - 145 mmol/L   Potassium 3.8 3.5 - 5.1 mmol/L   Chloride 103 98 - 111 mmol/L   CO2 25 22 - 32 mmol/L   Glucose, Bld 129 (H) 70 - 99 mg/dL    Comment: Glucose reference range applies only to samples taken after fasting for at least 8 hours.   BUN 10 6 - 20 mg/dL   Creatinine, Ser 4.13 0.61 - 1.24 mg/dL   Calcium 9.7 8.9 - 24.4 mg/dL   GFR, Estimated >01 >02 mL/min    Comment: (NOTE) Calculated using the CKD-EPI Creatinine Equation (2021)    Anion gap 12 5 - 15    Comment: Performed at [facility], [address] [place], [place] [address]  CBC     Status: None   Collection Time: 07/11/23 12:07 PM  Result Value Ref Range   WBC 10.0 4.0 - 10.5 K/uL   RBC 5.39 4.22 - 5.81 MIL/uL   Hemoglobin 15.0 13.0 - 17.0 g/dL   HCT 64.4 03.4 - 74.2 %   MCV 81.3 80.0 - 100.0 fL   MCH 27.8 26.0 - 34.0 pg   MCHC  34.2 30.0 - 36.0 g/dL   RDW 82.9 56.2 - 13.0 %   Platelets 185 150 - 400 K/uL   nRBC 0.0 0.0 - 0.2 %    Comment: Performed at [facility], [address] [place], [place] [address]  Troponin T, High Sensitivity     Status: None   Collection Time: 07/11/23 12:07 PM  Result Value Ref Range   Troponin T High Sensitivity <15 <19 ng/L    Comment: (NOTE) Biotin concentrations > 1000 ng/mL falsely decrease TnT results.  Serial cardiac troponin measurements are suggested.  Refer to the Links section for chest pain algorithms and additional  guidance. Performed at [facility], [address] [place], [place] [address]   Hepatic function panel     Status: Abnormal   Collection Time: 07/11/23 12:13  PM  Result Value Ref Range   Total Protein 7.3 6.5 - 8.1 g/dL   Albumin 4.3 3.5 - 5.0 g/dL   AST 952 (H) 15 - 41 U/L   ALT 302 (H) 0 - 44 U/L   Alkaline Phosphatase 187 (H) 38 - 126 U/L   Total Bilirubin 1.6 (H) 0.0 - 1.2 mg/dL   Bilirubin, Direct 1.1 (H) 0.0 - 0.2 mg/dL   Indirect Bilirubin 0.5 0.3 - 0.9 mg/dL    Comment: Performed at [facility], [address] [place], [place] [address]  Lipase, blood     Status: Abnormal   Collection Time: 07/11/23 12:13 PM  Result Value Ref Range   Lipase >2,800 (H) 11 - 51 U/L    Comment: Performed at [facility], [address] [place], [place] [address]  Troponin T, High Sensitivity     Status: None   Collection Time: 07/11/23  1:37 PM  Result Value Ref Range   Troponin T High Sensitivity <15 <19 ng/L    Comment: (NOTE) Biotin concentrations > 1000 ng/mL falsely decrease TnT results.  Serial cardiac troponin measurements are suggested.  Refer to the Links section for chest pain algorithms and additional  guidance. Performed at [facility], [address], [place], [place] [address]    US  Abdomen Limited RUQ (LIVER/GB) Result Date: 07/11/2023 CLINICAL DATA:  Epigastric pain for a week. Elevated lipase and amylase EXAM: ULTRASOUND ABDOMEN LIMITED RIGHT UPPER QUADRANT COMPARISON:  CT 07/11/2023 FINDINGS: Gallbladder: Distended gallbladder. Small amount of dependent sludge. No shadowing stones. No wall thickening or adjacent fluid. No reported sonographic Murphy's sign. Common bile duct: Diameter: 8 mm, slightly enlarged. Liver: Diffusely echogenic hepatic parenchyma consistent with fatty liver infiltration. Portal vein is patent on color Doppler imaging with normal direction of blood flow towards the liver. Other: None. IMPRESSION: Dilated gallbladder with slight ectasia of the common duct. There is some sludge identified. On prior CT scan there is a question of some dependent stones. These are not well seen  on the current examination. However with the appearance overall would recommend further workup such as MRCP when clinically appropriate. Fatty liver infiltration. Electronically Signed   By: Adrianna Horde M.D.   On: 07/11/2023 16:46   CT ABDOMEN PELVIS W CONTRAST Result Date: 07/11/2023 EXAM:  CT ABDOMEN PELVIS WITH IV CONTRAST INDICATION:  Abdominal pain, acute, nonlocalized TECHNIQUE: Spiral CT scanning was performed through the abdomen and pelvis after the patient received IV contrast. COMPARISON: 10/15/2022 FINDINGS: There is no significant abnormality identified in the lung bases, spleen, and pancreas. Hepatic steatosis is present. Cholelithiasis is  present. No renal calculus, obstruction, or soft tissue mass is present. There are no adrenal masses. The abdominal bowel loops are unremarkable. There is no evidence of ascites or adenopathy. No abdominal aortic aneurysm is present. 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 The bladder, prostate and seminal vesicles have a normal appearance. There is no inguinal hernia. There is mild sigmoid diverticulosis with no associated inflammation. The appendix has a normal appearance. There is no fracture or bone destruction. IMPRESSION: 1. No evidence of acute process. 2. Cholelithiasis. 3. Sigmoid diverticulosis. Please note that CT scanning at this site utilizes multiple dose reduction techniques, including automatic exposure control, adjustment of the MAA and/or KVP according to the patient's size, and use of iterative reconstruction. Electronically signed by: Buster Cash MD 07/11/2023 03:24 PM EDT RP Workstation: [id number]   DG Chest 2 View Result Date: 07/11/2023 CLINICAL DATA:  Chest pain and shortness of breath since Monday EXAM: CHEST - 2 VIEW COMPARISON:  10/14/2022 FINDINGS: Frontal and lateral views of the chest demonstrate a stable cardiac silhouette. Minimal left basilar scarring. No acute airspace disease, effusion, or pneumothorax. No acute bony  abnormality. IMPRESSION: 1. No acute intrathoracic process. Electronically Signed   By: Bobbye Burrow M.D.   On: 07/11/2023 14:43    Pending Labs Unresulted Labs (From admission, onward)    None       Vitals/Pain Today's Vitals   07/11/23 1742 07/11/23 1744 07/11/23 1745 07/11/23 1750  BP: (!) 144/90     Pulse: 77     Resp: 15     Temp:   98.4 F (36.9 C)   TempSrc:   Oral   SpO2: 97%     Weight:      Height:      PainSc:  7   2     Isolation Precautions No active isolations  Medications Medications  pantoprazole  (PROTONIX ) injection 40 mg (40 mg Intravenous Given 07/11/23 1306)  alum & mag hydroxide-simeth (MAALOX/MYLANTA) 200-200-20 MG/5ML suspension 30 mL (30 mLs Oral Given 07/11/23 1307)  iohexol  (OMNIPAQUE ) 300 MG/ML solution 100 mL (125 mLs Intravenous Contrast Given 07/11/23 1421)  ondansetron (ZOFRAN) injection 4 mg (4 mg Intravenous Given 07/11/23 1357)  HYDROmorphone (DILAUDID) injection 0.5 mg (0.5 mg Intravenous Given 07/11/23 1744)  ondansetron (ZOFRAN) injection 4 mg (4 mg Intravenous Given 07/11/23 1742)    Mobility walks     Focused Assessments Pt having upper abdominal pain with bloating.  Pain has improved since medication   R Recommendations: See Admitting Provider Note  Report given to:   Additional Notes:

## 2023-07-11 NOTE — Hospital Course (Signed)
 Jon Hurst is a 53 y.o. male with medical history significant for isolated episode of atrial flutter in 2018 not on anticoagulation, HLD who is admitted with acute gallstone pancreatitis.

## 2023-07-12 ENCOUNTER — Inpatient Hospital Stay (HOSPITAL_COMMUNITY)

## 2023-07-12 DIAGNOSIS — K851 Biliary acute pancreatitis without necrosis or infection: Secondary | ICD-10-CM | POA: Diagnosis not present

## 2023-07-12 LAB — COMPREHENSIVE METABOLIC PANEL WITH GFR
ALT: 463 U/L — ABNORMAL HIGH (ref 0–44)
AST: 258 U/L — ABNORMAL HIGH (ref 15–41)
Albumin: 3.9 g/dL (ref 3.5–5.0)
Alkaline Phosphatase: 166 U/L — ABNORMAL HIGH (ref 38–126)
Anion gap: 12 (ref 5–15)
BUN: 13 mg/dL (ref 6–20)
CO2: 24 mmol/L (ref 22–32)
Calcium: 9.1 mg/dL (ref 8.9–10.3)
Chloride: 100 mmol/L (ref 98–111)
Creatinine, Ser: 1.07 mg/dL (ref 0.61–1.24)
GFR, Estimated: >60 mL/min (ref >60)
Glucose, Bld: 136 mg/dL — ABNORMAL HIGH (ref 70–99)
Potassium: 3.8 mmol/L (ref 3.5–5.1)
Sodium: 136 mmol/L (ref 135–145)
Total Bilirubin: 1.4 mg/dL — ABNORMAL HIGH (ref 0.0–1.2)
Total Protein: 7.2 g/dL (ref 6.5–8.1)

## 2023-07-12 LAB — CBC
HCT: 44.6 % (ref 39.0–52.0)
Hemoglobin: 14.6 g/dL (ref 13.0–17.0)
MCH: 28.1 pg (ref 26.0–34.0)
MCHC: 32.7 g/dL (ref 30.0–36.0)
MCV: 85.9 fL (ref 80.0–100.0)
Platelets: 175 10*3/uL (ref 150–400)
RBC: 5.19 MIL/uL (ref 4.22–5.81)
RDW: 14 % (ref 11.5–15.5)
WBC: 9.3 10*3/uL (ref 4.0–10.5)
nRBC: 0 % (ref 0.0–0.2)

## 2023-07-12 LAB — HIV ANTIBODY (ROUTINE TESTING W REFLEX): HIV Screen 4th Generation wRfx: NONREACTIVE

## 2023-07-12 MED ORDER — GADOBUTROL 1 MMOL/ML IV SOLN
10.0000 mL | Freq: Once | INTRAVENOUS | Status: AC | PRN
  Administered 2023-07-12: 10 mL via INTRAVENOUS

## 2023-07-12 MED ORDER — DEXTROSE IN LACTATED RINGERS 5 % IV SOLN: INTRAVENOUS | Status: AC

## 2023-07-12 MED ORDER — BUTALBITAL-APAP-CAFFEINE 50-325-40 MG PO TABS: 2.0000 | ORAL_TABLET | Freq: Four times a day (QID) | ORAL | Status: DC | PRN

## 2023-07-12 NOTE — Consult Note (Addendum)
 Consult Note  Jon Hurst 05-18-1970  [id number].    Requesting MD: Junita Oliva, DO Chief Complaint/Reason for Consult: Biliary pancreatitis  HPI:  Patient is a 53 year old male who presented to the ED yesterday with epigastric abdominal pain. Pain started about 1 week prior to presentation and initially was intermittent and seemed to come on shortly after eating. Over that time symptoms started becoming more frequent and yesterday became more severe. Increased pain with deep inspiration yesterday as well. Did have some nausea yesterday and vomited once after CT. Last BM was this AM and was a little loose. Denied fever, chills. Currently symptoms are resolved. PMH otherwise significant for PFO and isolated episode of atrial flutter. Not on blood thinners and NKDA. No prior abdominal surgery. Patient's wife present at bedside for interview and exam.   ROS:  Negative other than HPI  Family History  Problem Relation Age of Onset   Heart failure Mother    Colon polyps Father    COPD Father    Colon cancer Father    Esophageal cancer Neg Hx    Stomach cancer Neg Hx    Rectal cancer Neg Hx     Past Medical History:  Diagnosis Date   Paroxysmal atrial flutter (HCC)    PFO (patent foramen ovale) 11/02/2016    Past Surgical History:  Procedure Laterality Date   ANTERIOR CRUCIATE LIGAMENT REPAIR     SHOULDER ACROMIOPLASTY     TEE WITHOUT CARDIOVERSION N/A 11/02/2016   Procedure: TRANSESOPHAGEAL ECHOCARDIOGRAM (TEE);  Surgeon: Luana Rumple, MD;  Location: [facility] ENDOSCOPY;  Service: Cardiovascular;  Laterality: N/A;    Social History:  reports that he quit smoking about 7 years ago. His smoking use included cigarettes. He has never used smokeless tobacco. He reports current alcohol  use. He reports that he does not use drugs.  Allergies: No Known Allergies  Medications Prior to Admission  Medication Sig Dispense Refill   Misc Natural Products (BEET ROOT PO) Take 1 tablet  by mouth daily.     pantoprazole  (PROTONIX ) 40 MG tablet Take 1 tablet (40 mg total) by mouth daily. 30 tablet 0   rosuvastatin (CRESTOR) 10 MG tablet Take 10 mg by mouth daily. (Patient not taking: Reported on 07/11/2023)      Blood pressure (!) 146/99, pulse 72, temperature 98.2 F (36.8 C), resp. rate 18, height 6' 1 (1.854 m), weight 108.9 kg, SpO2 98%. Physical Exam:  General: pleasant, WD, obese male who is laying in bed in NAD HEENT: head is normocephalic, atraumatic.  Sclera are anicteric.  Ears and nose without any masses or lesions.  Mouth is pink and moist Heart: regular, rate, and rhythm.  Palpable radial and pedal pulses bilaterally Lungs: No wheezes, rhonchi, or rales noted.  Respiratory effort nonlabored Abd: soft, NT, ND, no masses, hernias, or organomegaly MS: all 4 extremities are symmetrical with no cyanosis, clubbing, or edema. Skin: warm and dry with no masses, lesions, or rashes Neuro: Cranial nerves 2-12 grossly intact, sensation is normal throughout Psych: A&Ox3 with an appropriate affect.   Results for orders placed or performed during the hospital encounter of 07/11/23 (from the past 48 hours)  Basic metabolic panel     Status: Abnormal   Collection Time: 07/11/23 12:07 PM  Result Value Ref Range   Sodium 139 135 - 145 mmol/L   Potassium 3.8 3.5 - 5.1 mmol/L   Chloride 103 98 - 111 mmol/L   CO2 25 22 - 32  mmol/L   Glucose, Bld 129 (H) 70 - 99 mg/dL    Comment: Glucose reference range applies only to samples taken after fasting for at least 8 hours.   BUN 10 6 - 20 mg/dL   Creatinine, Ser 1.91 0.61 - 1.24 mg/dL   Calcium 9.7 8.9 - 47.8 mg/dL   GFR, Estimated >29 >56 mL/min    Comment: (NOTE) Calculated using the CKD-EPI Creatinine Equation (2021)    Anion gap 12 5 - 15    Comment: Performed at [facility], [address] [place], [place] [address]  CBC     Status: None   Collection Time: 07/11/23 12:07 PM  Result Value Ref Range   WBC  10.0 4.0 - 10.5 K/uL   RBC 5.39 4.22 - 5.81 MIL/uL   Hemoglobin 15.0 13.0 - 17.0 g/dL   HCT 65.7 84.6 - 96.2 %   MCV 81.3 80.0 - 100.0 fL   MCH 27.8 26.0 - 34.0 pg   MCHC 34.2 30.0 - 36.0 g/dL   RDW 95.2 84.1 - 32.4 %   Platelets 185 150 - 400 K/uL   nRBC 0.0 0.0 - 0.2 %    Comment: Performed at [facility], [address] [place], [place] [address]  Troponin T, High Sensitivity     Status: None   Collection Time: 07/11/23 12:07 PM  Result Value Ref Range   Troponin T High Sensitivity <15 <19 ng/L    Comment: (NOTE) Biotin concentrations > 1000 ng/mL falsely decrease TnT results.  Serial cardiac troponin measurements are suggested.  Refer to the Links section for chest pain algorithms and additional  guidance. Performed at [facility], [address] [place], [place] [address]   Hepatic function panel     Status: Abnormal   Collection Time: 07/11/23 12:13 PM  Result Value Ref Range   Total Protein 7.3 6.5 - 8.1 g/dL   Albumin 4.3 3.5 - 5.0 g/dL   AST 644 (H) 15 - 41 U/L   ALT 302 (H) 0 - 44 U/L   Alkaline Phosphatase 187 (H) 38 - 126 U/L   Total Bilirubin 1.6 (H) 0.0 - 1.2 mg/dL   Bilirubin, Direct 1.1 (H) 0.0 - 0.2 mg/dL   Indirect Bilirubin 0.5 0.3 - 0.9 mg/dL    Comment: Performed at [facility], [address] [place], [place] [address]  Lipase, blood     Status: Abnormal   Collection Time: 07/11/23 12:13 PM  Result Value Ref Range   Lipase >2,800 (H) 11 - 51 U/L    Comment: Performed at [facility], [address] [place], [place] [address]  Troponin T, High Sensitivity     Status: None   Collection Time: 07/11/23  1:37 PM  Result Value Ref Range   Troponin T High Sensitivity <15 <19 ng/L    Comment: (NOTE) Biotin concentrations > 1000 ng/mL falsely decrease TnT results.  Serial cardiac troponin measurements are suggested.  Refer to the Links section for chest pain algorithms and additional   guidance. Performed at [facility], [address] [place], [place] [address]   Comprehensive metabolic panel     Status: Abnormal   Collection Time: 07/12/23  4:31 AM  Result Value Ref Range   Sodium 136 135 - 145 mmol/L   Potassium 3.8 3.5 - 5.1 mmol/L   Chloride 100 98 - 111 mmol/L   CO2 24 22 -  32 mmol/L   Glucose, Bld 136 (H) 70 - 99 mg/dL    Comment: Glucose reference range applies only to samples taken after fasting for at least 8 hours.   BUN 13 6 - 20 mg/dL   Creatinine, Ser 9.62 0.61 - 1.24 mg/dL   Calcium 9.1 8.9 - 95.2 mg/dL   Total Protein 7.2 6.5 - 8.1 g/dL   Albumin 3.9 3.5 - 5.0 g/dL   AST 841 (H) 15 - 41 U/L   ALT 463 (H) 0 - 44 U/L   Alkaline Phosphatase 166 (H) 38 - 126 U/L   Total Bilirubin 1.4 (H) 0.0 - 1.2 mg/dL   GFR, Estimated >32 >44 mL/min    Comment: (NOTE) Calculated using the CKD-EPI Creatinine Equation (2021)    Anion gap 12 5 - 15    Comment: Performed at [facility], [address] [place], [place] [address]  CBC     Status: None   Collection Time: 07/12/23  4:31 AM  Result Value Ref Range   WBC 9.3 4.0 - 10.5 K/uL   RBC 5.19 4.22 - 5.81 MIL/uL   Hemoglobin 14.6 13.0 - 17.0 g/dL   HCT 25.3 66.4 - 40.3 %   MCV 85.9 80.0 - 100.0 fL   MCH 28.1 26.0 - 34.0 pg   MCHC 32.7 30.0 - 36.0 g/dL   RDW 47.4 25.9 - 56.3 %   Platelets 175 150 - 400 K/uL   nRBC 0.0 0.0 - 0.2 %    Comment: Performed at [facility], [address] [place], [place] [address]   US  Abdomen Limited RUQ (LIVER/GB) Result Date: 07/11/2023 CLINICAL DATA:  Epigastric pain for a week. Elevated lipase and amylase EXAM: ULTRASOUND ABDOMEN LIMITED RIGHT UPPER QUADRANT COMPARISON:  CT 07/11/2023 FINDINGS: Gallbladder: Distended gallbladder. Small amount of dependent sludge. No shadowing stones. No wall thickening or adjacent fluid. No reported sonographic Murphy's sign. Common bile duct: Diameter: 8 mm, slightly enlarged. Liver:  Diffusely echogenic hepatic parenchyma consistent with fatty liver infiltration. Portal vein is patent on color Doppler imaging with normal direction of blood flow towards the liver. Other: None. IMPRESSION: Dilated gallbladder with slight ectasia of the common duct. There is some sludge identified. On prior CT scan there is a question of some dependent stones. These are not well seen on the current examination. However with the appearance overall would recommend further workup such as MRCP when clinically appropriate. Fatty liver infiltration. Electronically Signed   By: Adrianna Horde M.D.   On: 07/11/2023 16:46   CT ABDOMEN PELVIS W CONTRAST Result Date: 07/11/2023 EXAM:  CT ABDOMEN PELVIS WITH IV CONTRAST INDICATION:  Abdominal pain, acute, nonlocalized TECHNIQUE: Spiral CT scanning was performed through the abdomen and pelvis after the patient received IV contrast. COMPARISON: 10/15/2022 FINDINGS: There is no significant abnormality identified in the lung bases, spleen, and pancreas. Hepatic steatosis is present. Cholelithiasis is present. No renal calculus, obstruction, or soft tissue mass is present. There are no adrenal masses. The abdominal bowel loops are unremarkable. There is no evidence of ascites or adenopathy. No abdominal aortic aneurysm is present. 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 The bladder, prostate and seminal vesicles have a normal appearance. There is no inguinal hernia. There is mild sigmoid diverticulosis with no associated inflammation. The appendix has a normal appearance. There is no fracture or bone destruction. IMPRESSION: 1. No evidence of acute process. 2. Cholelithiasis. 3. Sigmoid diverticulosis. Please note that CT scanning at this site utilizes  multiple dose reduction techniques, including automatic exposure control, adjustment of the MAA and/or KVP according to the patient's size, and use of iterative reconstruction. Electronically signed by: Buster Cash MD 07/11/2023  03:24 PM EDT RP Workstation: [id number]   DG Chest 2 View Result Date: 07/11/2023 CLINICAL DATA:  Chest pain and shortness of breath since Monday EXAM: CHEST - 2 VIEW COMPARISON:  10/14/2022 FINDINGS: Frontal and lateral views of the chest demonstrate a stable cardiac silhouette. Minimal left basilar scarring. No acute airspace disease, effusion, or pneumothorax. No acute bony abnormality. IMPRESSION: 1. No acute intrathoracic process. Electronically Signed   By: Bobbye Burrow M.D.   On: 07/11/2023 14:43      Assessment/Plan Biliary pancreatitis - CT 6/18 with cholelithiasis - RUQ US  6/18 with sludge and slightly enlarged CBD - MRCP done today, read pending - lipase >2800 on admit, AST/ALT 299/302, Tbili 1.6 - lipase not checked today, AST/ALT 258/463, Tbili 1.4 - no leukocytosis, HD stable, afebrile  - GI consulted as well - will need laparoscopic cholecystectomy prior to discharge, will follow for planning  - I have explained the procedure, risks, and aftercare of Laparoscopic cholecystectomy with/without IOC.  Risks include but are not limited to anesthesia (MI, CVA, death, prolonged intubation and aspiration), bleeding, infection, wound problems, hernia, bile leak, injury to common bile duct/liver/intestine, possible need for subtotal cholecystectomy or open cholecystectomy, increased risk of DVT/PE and diarrhea post op.  He seems to understand and agrees to proceed.   FEN: CLD, no current IVF VTE: SCDs ID: no current abx  - per [facility] -  PFO Hx of Paroxysmal atrial flutter  I reviewed ED provider notes, hospitalist notes, last 24 h vitals and pain scores, last 48 h intake and output, last 24 h labs and trends, and last 24 h imaging results.  This care required high  level of medical decision making.   Annetta Killian, [facility] 07/12/2023, 12:32 PM Please see Amion for pager number during day hours 7:00am-4:30pm

## 2023-07-12 NOTE — H&P (View-Only) (Signed)
 [facility] Progress Note  CC:  Upper abdominal pain, choledocholithiasis, gallstone pancreatitis   GI consult was completed by Dr. Kimble Pennant with [facility] earlier today, however, it was later determined that the patient is followed by Dr. General Kenner with [facility] therefore our inpatient [facility] service will take over patient's care during this hospitalization. I explained the transfer of care from [facility] to [facility] to the patient and his wife and they appreciated this information.   Subjective:   He endorsed having indigestion/epigastric pain for the past week, worse about 30 minutes after eating. He thought he was having acid reflux so he restarted Protonix  40mg  once daily without improvement. His abdominal pain worsened yesterday therefore he presented to the ED for further evaluation. WBC 10. Hg 15. T. Bili 1.6. Direct bili 1.6. Alk phos 187. AST 299. ALT 302. Lipase > 2,800. Labs today: T. Bili 1.4. Alk phos 166. AST 258. ALT 463.  Lipase level not done today.  CTAP showed cholelithiasis, hepatic steatosis and a normal pancreas. RUQ sonogram showed a distended gallbladder with dependent sludge with CBD measuring 8 mm without evidence of choledocholithiasis. Abdominal MRI/MRCP showed evidence of 2 CBD stones as reviewed by Dr. Kimble Pennant, official read by radiologist pending. Patient is scheduled for an ERCP tomorrow with Dr. Willy Harvest.  No NSAID use. Not on blood thinners. Past history of paroxysmal atrial flutter and PFO. Denies having any chest pain, palpitations, dizziness, cough or shortness of breath.   Objective:  Vital signs in last 24 hours: Temp:  [98 F (36.7 C)-98.8 F (37.1 C)] 98.2 F (36.8 C) (06/19 1209) Pulse Rate:  [72-83] 72 (06/19 1209) Resp:  [15-18] 18 (06/19 0426) BP: (132-149)/(75-99) 146/99 (06/19 1209) SpO2:  [95 %-98 %] 98 % (06/19 1209) Last BM Date : 07/11/23 General: 53 year old male in no acute distress. Heart: Regular rate and rhythm, no  murmurs. Pulm: Breath sounds clear throughout. Abdomen: Soft, nondistended. Mild epigastric tenderness without rebound or guarding. Positive bowel sounds to all 4 quadrants. No hepatosplenomegaly. No palpable mass. Extremities:  No edema. Neurologic:  Alert and oriented x 4. Grossly normal neurologically. Psych:  Alert and cooperative. Normal mood and affect.  Intake/Output from previous day: No intake/output data recorded. Intake/Output this shift: No intake/output data recorded.  Lab Results: Recent Labs    07/11/23 1207 07/12/23 0431  WBC 10.0 9.3  HGB 15.0 14.6  HCT 43.8 44.6  PLT 185 175   BMET Recent Labs    07/11/23 1207 07/12/23 0431  NA 139 136  K 3.8 3.8  CL 103 100  CO2 25 24  GLUCOSE 129* 136*  BUN 10 13  CREATININE 1.01 1.07  CALCIUM 9.7 9.1   LFT Recent Labs    07/11/23 1213 07/12/23 0431  PROT 7.3 7.2  ALBUMIN 4.3 3.9  AST 299* 258*  ALT 302* 463*  ALKPHOS 187* 166*  BILITOT 1.6* 1.4*  BILIDIR 1.1*  --   IBILI 0.5  --    PT/INR No results for input(s): LABPROT, INR in the last 72 hours. Hepatitis Panel No results for input(s): HEPBSAG, HCVAB, HEPAIGM, HEPBIGM in the last 72 hours.  US  Abdomen Limited RUQ (LIVER/GB) Result Date: 07/11/2023 CLINICAL DATA:  Epigastric pain for a week. Elevated lipase and amylase EXAM: ULTRASOUND ABDOMEN LIMITED RIGHT UPPER QUADRANT COMPARISON:  CT 07/11/2023 FINDINGS: Gallbladder: Distended gallbladder. Small amount of dependent sludge. No shadowing stones. No wall thickening or adjacent fluid. No reported sonographic Murphy's sign. Common bile duct: Diameter:  8 mm, slightly enlarged. Liver: Diffusely echogenic hepatic parenchyma consistent with fatty liver infiltration. Portal vein is patent on color Doppler imaging with normal direction of blood flow towards the liver. Other: None. IMPRESSION: Dilated gallbladder with slight ectasia of the common duct. There is some sludge identified. On prior CT scan  there is a question of some dependent stones. These are not well seen on the current examination. However with the appearance overall would recommend further workup such as MRCP when clinically appropriate. Fatty liver infiltration. Electronically Signed   By: Adrianna Horde M.D.   On: 07/11/2023 16:46   CT ABDOMEN PELVIS W CONTRAST Result Date: 07/11/2023 EXAM:  CT ABDOMEN PELVIS WITH IV CONTRAST INDICATION:  Abdominal pain, acute, nonlocalized TECHNIQUE: Spiral CT scanning was performed through the abdomen and pelvis after the patient received IV contrast. COMPARISON: 10/15/2022 FINDINGS: There is no significant abnormality identified in the lung bases, spleen, and pancreas. Hepatic steatosis is present. Cholelithiasis is present. No renal calculus, obstruction, or soft tissue mass is present. There are no adrenal masses. The abdominal bowel loops are unremarkable. There is no evidence of ascites or adenopathy. No abdominal aortic aneurysm is present. 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 The bladder, prostate and seminal vesicles have a normal appearance. There is no inguinal hernia. There is mild sigmoid diverticulosis with no associated inflammation. The appendix has a normal appearance. There is no fracture or bone destruction. IMPRESSION: 1. No evidence of acute process. 2. Cholelithiasis. 3. Sigmoid diverticulosis. Please note that CT scanning at this site utilizes multiple dose reduction techniques, including automatic exposure control, adjustment of the MAA and/or KVP according to the patient's size, and use of iterative reconstruction. Electronically signed by: Buster Cash MD 07/11/2023 03:24 PM EDT RP Workstation: [id number]   DG Chest 2 View Result Date: 07/11/2023 CLINICAL DATA:  Chest pain and shortness of breath since Monday EXAM: CHEST - 2 VIEW COMPARISON:  10/14/2022 FINDINGS: Frontal and lateral views of the chest demonstrate a stable cardiac silhouette. Minimal left basilar scarring. No  acute airspace disease, effusion, or pneumothorax. No acute bony abnormality. IMPRESSION: 1. No acute intrathoracic process. Electronically Signed   By: Bobbye Burrow M.D.   On: 07/11/2023 14:43    Assessment / Plan:  53 year old male admitted with epigastric pain x 1 week with 1 episode of vomiting, elevated LFTs and lipase level. T. Bili 1.6 -> 1.4. Direct bili 1.6. Alk phos 187 -> 166. AST 299 -> 166. ALT 302 -> 463. Lipase > 2,800. CTAP showed cholelithiasis, hepatic steatosis and a normal pancreas. RUQ sonogram showed a distended gallbladder with dependent sludge with CBD measuring 8 mm without evidence of choledocholithiasis. Abdominal MRI/MRCP showed evidence of 2 CBD stones as reviewed by Dr. Kimble Pennant, final report per radiology pending. Patient has choledocholithiasis with biliary pancreatitis.  Afebrile.  Hemodynamically stable. - Clear liquid diet - NPO after midnight - ERCP tomorrow with Dr. Willy Harvest benefits and risks discussed including risk with sedation, risk of bleeding, perforation, pancreatitis and infection discussed with the patient and spouse - Pain management and IV fluids ( D5LR @ 125cc/hr)  per the hospitalist - CBC, CMP and lipase level in am - Await further recommendations per Dr. Willy Harvest  - General Surgery following, patient will need laparoscopic cholecystectomy during this hospital admission   History of PFO, followed by cardiology. TEE 2018 showed LVEF 55 to 60%, a patent foramen ovale. Doppler showed a trivial bidirectional, but predominantly left-to-right, atrial level shunt, in the baseline state.  Isolated atrial flutter 2018 not on [facility]  Principal Problem:   Gallstone pancreatitis Active Problems:   Hypercholesterolemia   History of atrial flutter     LOS: 1 day   Jon Hurst  07/12/2023, 3:23 PM    [facility] Attending   I have taken an interval history, reviewed the chart and examined the patient. I agree with the Advanced Practitioner's  note, impression and recommendations with the following additions:  I reviewed his situation with him and explained plans for and risks, benefits and indications of/for ERCP with sphincterotomy and stone extraction.  Kenney Peacemaker, MD, [facility] See Tilford Foley on call - gastroenterology for best contact person 07/12/2023 6:11 PM

## 2023-07-12 NOTE — TOC Initial Note (Signed)
 Transition of Care [facility]) - Initial/Assessment Note    Patient Details  Name: Jon Hurst MRN: [id number] Date of Birth: 1970/04/25  Transition of Care [facility] CM/SW Contact:    Kathryn Parish, RN Phone Number: 07/12/2023, 10:45 AM  Clinical Narrative:                 Presented for gallstones. PTA lives in a house with spouse Edwina Gram; PCP/insurance verified; drives to self to appointments; No DME,HH,oxygen or SDOH needs;Spouse Edwina Gram will transport ome at discharge. No TOC needs identified during visit.  TOC signing off. Please place consult if needs present.  Expected Discharge Plan: Home/Self Care Barriers to Discharge: No Barriers Identified   Patient Goals and CMS Choice Patient states their goals for this hospitalization and ongoing recovery are:: [place] with spouse CMS Medicare.gov Compare Post Acute Care list provided to::  (NA) Choice offered to / list presented to : NA [place] ownership interest [facility].provided to:: Parent NA    Expected Discharge Plan and Services In-house Referral: NA Discharge Planning Services: CM Consult Post Acute Care Choice: NA Living arrangements for the past 2 months: Single Family Home                 DME Arranged: N/A DME Agency: NA       HH Arranged: NA HH Agency: NA        Prior Living Arrangements/Services Living arrangements for the past 2 months: Single Family Home Lives with:: Spouse Patient language and need for interpreter reviewed:: Yes Do you feel safe going back to the place where you live?: Yes      Need for Family Participation in Patient Care: No (Comment) Care giver support system in place?: Yes (comment) Current home services:  (NA) Criminal Activity/Legal Involvement Pertinent to Current Situation/Hospitalization: No - Comment as needed  Activities of Daily Living   ADL Screening (condition at time of admission) Independently performs ADLs?: Yes (appropriate for developmental age) Is the  patient deaf or have difficulty hearing?: No Does the patient have difficulty seeing, even when wearing glasses/contacts?: No Does the patient have difficulty concentrating, remembering, or making decisions?: No  Permission Sought/Granted Permission sought to share information with : Case Manager Permission granted to share information with : Yes, Verbal Permission Granted  Share Information with NAME: Edwina Gram     Permission granted to share info w Relationship: spouse  Permission granted to share info w Contact Information: [phone]  Emotional Assessment Appearance:: Appears stated age Attitude/Demeanor/Rapport: Engaged Affect (typically observed): Appropriate Orientation: : Oriented to Self, Oriented to Place, Oriented to  Time, Oriented to Situation Alcohol  / Substance Use: Not Applicable Psych Involvement: No (comment)  Admission diagnosis:  Gallstone pancreatitis [K85.10] Patient Active Problem List   Diagnosis Date Noted   Gallstone pancreatitis 07/11/2023   Patent foramen ovale 02/27/2022   History of atrial flutter 02/27/2022   ASD (atrial septal defect)    Abnormal ECG 03/22/2016   Mild obesity 03/22/2016   Hypercholesterolemia 03/22/2016   PCP:  Lonzie Robins, MD Pharmacy:   [facility] - [place], [place] - [address] [place] [address] Phone: [phone] Fax: [phone]  [facility] [phone] - [place], [place] - [address] [place] [address] Phone: [phone] Fax: [phone]     Social Drivers of Health (SDOH) Social History: SDOH Screenings   Food Insecurity: No Food Insecurity (07/11/2023)  Housing: Unknown (07/11/2023)  Transportation Needs: No Transportation Needs (  07/11/2023)  Utilities: Not At Risk (07/11/2023)  Social Connections: Socially Integrated (07/11/2023)  Tobacco Use: Medium Risk (07/11/2023)   SDOH Interventions:     Readmission Risk Interventions     No  data to display

## 2023-07-12 NOTE — Consult Note (Signed)
 [facility] Consultation Note  Referring Provider: [facility] Primary Care Physician:  Avva, Ravisankar, MD Primary Gastroenterologist:  Dr. General Kenner  Reason for Consultation:  abdominal pain  HPI: Jon Hurst is a 53 y.o. male with few weeks of discomfort felt to be GERD.  Presented for acute worsening of this discomfort.  Found to have elevated LFTs and lipase.  MRCP seems to show CBD stones.  Patient's abdominal discomfort essentially resolved.   Past Medical History:  Diagnosis Date   Paroxysmal atrial flutter (HCC)    PFO (patent foramen ovale) 11/02/2016    Past Surgical History:  Procedure Laterality Date   ANTERIOR CRUCIATE LIGAMENT REPAIR     SHOULDER ACROMIOPLASTY     TEE WITHOUT CARDIOVERSION N/A 11/02/2016   Procedure: TRANSESOPHAGEAL ECHOCARDIOGRAM (TEE);  Surgeon: Luana Rumple, MD;  [facility] ENDOSCOPY;  Service: Cardiovascular;  Laterality: N/A;    Prior to Admission medications   Medication Sig Start Date End Date Taking? Authorizing Provider  Misc Natural Products (BEET ROOT PO) Take 1 tablet by mouth daily.   Yes [provider]  pantoprazole  (PROTONIX ) 40 MG tablet Take 1 tablet (40 mg total) by mouth daily. 10/15/22  Yes Alissa April, MD  rosuvastatin (CRESTOR) 10 MG tablet Take 10 mg by mouth daily. Patient not taking: Reported on 07/11/2023    [provider]    Current Facility-Administered Medications  Medication Dose Route Frequency Provider Last Rate Last Admin   butalbital-acetaminophen-caffeine (FIORICET) 50-325-40 MG per tablet 2 tablet  2 tablet Oral Q6H PRN Swayze, Ava, DO       morphine  (PF) 2 MG/ML injection 2 mg  2 mg Intravenous Q2H PRN Patel, Vishal R, MD       ondansetron (ZOFRAN) injection 4 mg  4 mg Intravenous Q6H PRN Kenny Peals, MD        Allergies as of 07/11/2023   (No Known Allergies)    Family History  Problem Relation Age of Onset   Heart failure Mother    Colon polyps  Father    COPD Father    Colon cancer Father    Esophageal cancer Neg Hx    Stomach cancer Neg Hx    Rectal cancer Neg Hx     Social History   Socioeconomic History   Marital status: Married    Spouse name: Not on file   Number of children: Not on file   Years of education: Not on file   Highest education level: Not on file  Occupational History   Not on file  Tobacco Use   Smoking status: Former    Current packs/day: 0.00    Types: Cigarettes    Quit date: 12/08/2015    Years since quitting: 7.5   Smokeless tobacco: Never  Vaping Use   Vaping status: Never Used  Substance and Sexual Activity   Alcohol  use: Yes    Comment: ocassionally   Drug use: No   Sexual activity: Not on file  Other Topics Concern   Not on file  Social History Narrative   Not on file   Social Drivers of Health   Financial Resource Strain: Not on file  Food Insecurity: No Food Insecurity (07/11/2023)   Hunger Vital Sign    Worried About Running Out of Food in the Last Year: Never true    Ran Out of Food in the Last Year: Never true  Transportation Needs: No Transportation Needs (07/11/2023)   PRAPARE - Transportation    Lack of Transportation (  Medical): No    Lack of Transportation (Non-Medical): No  Physical Activity: Not on file  Stress: Not on file  Social Connections: Socially Integrated (07/11/2023)   Social Connection and Isolation Panel    Frequency of Communication with Friends and Family: More than three times a week    Frequency of Social Gatherings with Friends and Family: More than three times a week    Attends Religious Services: More than 4 times per year    Active Member of [employer] or Organizations: Yes    Attends Banker Meetings: More than 4 times per year    Marital Status: Married  Catering manager Violence: Not At Risk (07/11/2023)   Humiliation, Afraid, Rape, and Kick questionnaire    Fear of Current or Ex-Partner: No    Emotionally Abused: No    Physically  Abused: No    Sexually Abused: No    Review of Systems: As per HPI, all others negative.  Physical Exam: Vital signs in last 24 hours: Temp:  [98 F (36.7 C)-98.8 F (37.1 C)] 98.2 F (36.8 C) (06/19 1209) Pulse Rate:  [69-83] 72 (06/19 1209) Resp:  [15-18] 18 (06/19 0426) BP: (132-149)/(75-99) 146/99 (06/19 1209) SpO2:  [95 %-98 %] 98 % (06/19 1209) Last BM Date : 07/11/23 General:   Alert, overweight, pleasant and cooperative in NAD Head:  Normocephalic and atraumatic. Eyes:  Sclera faintly icteric.   Conjunctiva pink. Ears:  Normal auditory acuity. Nose:  No deformity, discharge,  or lesions. Mouth:  No deformity or lesions.  Oropharynx pink & moist. Neck:  Supple; no masses or thyromegaly. Lungs:  Clear throughout to auscultation.   No wheezes, crackles, or rhonchi. No acute distress. Heart:  Regular rate and rhythm; no murmurs, clicks, rubs,  or gallops. Abdomen:  Soft, protuberant, non-tender, no peritonitis without guarding, and without rebound.     Msk:  Symmetrical without gross deformities. Normal posture. Pulses:  Normal pulses noted. Extremities:  Without clubbing or edema. Neurologic:  Alert and  oriented x4; grossly normal neurologically. Skin:  Intact without significant lesions or rashes. Psych:  Alert and cooperative. Normal mood and affect.   Lab Results: Recent Labs    07/11/23 1207 07/12/23 0431  WBC 10.0 9.3  HGB 15.0 14.6  HCT 43.8 44.6  PLT 185 175   BMET Recent Labs    07/11/23 1207 07/12/23 0431  NA 139 136  K 3.8 3.8  CL 103 100  CO2 25 24  GLUCOSE 129* 136*  BUN 10 13  CREATININE 1.01 1.07  CALCIUM 9.7 9.1   LFT Recent Labs    07/11/23 1213 07/12/23 0431  PROT 7.3 7.2  ALBUMIN 4.3 3.9  AST 299* 258*  ALT 302* 463*  ALKPHOS 187* 166*  BILITOT 1.6* 1.4*  BILIDIR 1.1*  --   IBILI 0.5  --    PT/INR No results for input(s): LABPROT, INR in the last 72 hours.  Studies/Results: US  Abdomen Limited RUQ  (LIVER/GB) Result Date: 07/11/2023 CLINICAL DATA:  Epigastric pain for a week. Elevated lipase and amylase EXAM: ULTRASOUND ABDOMEN LIMITED RIGHT UPPER QUADRANT COMPARISON:  CT 07/11/2023 FINDINGS: Gallbladder: Distended gallbladder. Small amount of dependent sludge. No shadowing stones. No wall thickening or adjacent fluid. No reported sonographic Murphy's sign. Common bile duct: Diameter: 8 mm, slightly enlarged. Liver: Diffusely echogenic hepatic parenchyma consistent with fatty liver infiltration. Portal vein is patent on color Doppler imaging with normal direction of blood flow towards the liver. Other: None. IMPRESSION: Dilated gallbladder with  slight ectasia of the common duct. There is some sludge identified. On prior CT scan there is a question of some dependent stones. These are not well seen on the current examination. However with the appearance overall would recommend further workup such as MRCP when clinically appropriate. Fatty liver infiltration. Electronically Signed   By: Adrianna Horde M.D.   On: 07/11/2023 16:46   CT ABDOMEN PELVIS W CONTRAST Result Date: 07/11/2023 EXAM:  CT ABDOMEN PELVIS WITH IV CONTRAST INDICATION:  Abdominal pain, acute, nonlocalized TECHNIQUE: Spiral CT scanning was performed through the abdomen and pelvis after the patient received IV contrast. COMPARISON: 10/15/2022 FINDINGS: There is no significant abnormality identified in the lung bases, spleen, and pancreas. Hepatic steatosis is present. Cholelithiasis is present. No renal calculus, obstruction, or soft tissue mass is present. There are no adrenal masses. The abdominal bowel loops are unremarkable. There is no evidence of ascites or adenopathy. No abdominal aortic aneurysm is present. 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 The bladder, prostate and seminal vesicles have a normal appearance. There is no inguinal hernia. There is mild sigmoid diverticulosis with no associated inflammation. The appendix has a  normal appearance. There is no fracture or bone destruction. IMPRESSION: 1. No evidence of acute process. 2. Cholelithiasis. 3. Sigmoid diverticulosis. Please note that CT scanning at this site utilizes multiple dose reduction techniques, including automatic exposure control, adjustment of the MAA and/or KVP according to the patient's size, and use of iterative reconstruction. Electronically signed by: Buster Cash MD 07/11/2023 03:24 PM EDT RP Workstation: [id number]   DG Chest 2 View Result Date: 07/11/2023 CLINICAL DATA:  Chest pain and shortness of breath since Monday EXAM: CHEST - 2 VIEW COMPARISON:  10/14/2022 FINDINGS: Frontal and lateral views of the chest demonstrate a stable cardiac silhouette. Minimal left basilar scarring. No acute airspace disease, effusion, or pneumothorax. No acute bony abnormality. IMPRESSION: 1. No acute intrathoracic process. Electronically Signed   By: Bobbye Burrow M.D.   On: 07/11/2023 14:43    Impression:   Gallstone pancreatitis.  Resolved. Abdominal pain, from #1 above, essentially resolved. No pancreatitis seen on CT imaging. Elevated LFTs, downtrending. Gallstones. MRCP:  Final report pending but there are clearly at least two distal CBD stones on images I reviewed.  Plan:   Clear liquid diet, NPO after midnight. Endoscopic retrograde cholangiopancreatography for biliary sphincterotomy and bile duct stone extraction, hopefully for tomorrow (anesthesia space pending). Risks (up to and including bleeding, infection, perforation, pancreatitis that can be complicated by infected necrosis and death), benefits (removal of stones, alleviating blockage, decreasing risk of cholangitis or choledocholithiasis-related pancreatitis), and alternatives (watchful waiting, percutaneous transhepatic cholangiography) of ERCP were explained to patient/family in detail and patient elects to proceed.  Surgical consult for consideration of cholecystectomy post-ERCP. Appears  patient may be [facility] primary GI patient.  If so, I will hand patient off to them; if not we'll continue care.   LOS: 1 day   Cambryn Charters M  07/12/2023, 1:31 PM  Cell [phone] If no answer or after 5 PM call [phone]

## 2023-07-12 NOTE — Progress Notes (Addendum)
 [facility] Progress Note  CC:  Upper abdominal pain, choledocholithiasis, gallstone pancreatitis   GI consult was completed by Dr. Kimble Pennant with [facility] earlier today, however, it was later determined that the patient is followed by Dr. General Kenner with [facility] therefore our inpatient [facility] service will take over patient's care during this hospitalization. I explained the transfer of care from [facility] to [facility] to the patient and his wife and they appreciated this information.   Subjective:   He endorsed having indigestion/epigastric pain for the past week, worse about 30 minutes after eating. He thought he was having acid reflux so he restarted Protonix  40mg  once daily without improvement. His abdominal pain worsened yesterday therefore he presented to the ED for further evaluation. WBC 10. Hg 15. T. Bili 1.6. Direct bili 1.6. Alk phos 187. AST 299. ALT 302. Lipase > 2,800. Labs today: T. Bili 1.4. Alk phos 166. AST 258. ALT 463.  Lipase level not done today.  CTAP showed cholelithiasis, hepatic steatosis and a normal pancreas. RUQ sonogram showed a distended gallbladder with dependent sludge with CBD measuring 8 mm without evidence of choledocholithiasis. Abdominal MRI/MRCP showed evidence of 2 CBD stones as reviewed by Dr. Kimble Pennant, official read by radiologist pending. Patient is scheduled for an ERCP tomorrow with Dr. Willy Harvest.  No NSAID use. Not on blood thinners. Past history of paroxysmal atrial flutter and PFO. Denies having any chest pain, palpitations, dizziness, cough or shortness of breath.   Objective:  Vital signs in last 24 hours: Temp:  [98 F (36.7 C)-98.8 F (37.1 C)] 98.2 F (36.8 C) (06/19 1209) Pulse Rate:  [72-83] 72 (06/19 1209) Resp:  [15-18] 18 (06/19 0426) BP: (132-149)/(75-99) 146/99 (06/19 1209) SpO2:  [95 %-98 %] 98 % (06/19 1209) Last BM Date : 07/11/23 General: 53 year old male in no acute distress. Heart: Regular rate and rhythm, no  murmurs. Pulm: Breath sounds clear throughout. Abdomen: Soft, nondistended. Mild epigastric tenderness without rebound or guarding. Positive bowel sounds to all 4 quadrants. No hepatosplenomegaly. No palpable mass. Extremities:  No edema. Neurologic:  Alert and oriented x 4. Grossly normal neurologically. Psych:  Alert and cooperative. Normal mood and affect.  Intake/Output from previous day: No intake/output data recorded. Intake/Output this shift: No intake/output data recorded.  Lab Results: Recent Labs    07/11/23 1207 07/12/23 0431  WBC 10.0 9.3  HGB 15.0 14.6  HCT 43.8 44.6  PLT 185 175   BMET Recent Labs    07/11/23 1207 07/12/23 0431  NA 139 136  K 3.8 3.8  CL 103 100  CO2 25 24  GLUCOSE 129* 136*  BUN 10 13  CREATININE 1.01 1.07  CALCIUM 9.7 9.1   LFT Recent Labs    07/11/23 1213 07/12/23 0431  PROT 7.3 7.2  ALBUMIN 4.3 3.9  AST 299* 258*  ALT 302* 463*  ALKPHOS 187* 166*  BILITOT 1.6* 1.4*  BILIDIR 1.1*  --   IBILI 0.5  --    PT/INR No results for input(s): LABPROT, INR in the last 72 hours. Hepatitis Panel No results for input(s): HEPBSAG, HCVAB, HEPAIGM, HEPBIGM in the last 72 hours.  US  Abdomen Limited RUQ (LIVER/GB) Result Date: 07/11/2023 CLINICAL DATA:  Epigastric pain for a week. Elevated lipase and amylase EXAM: ULTRASOUND ABDOMEN LIMITED RIGHT UPPER QUADRANT COMPARISON:  CT 07/11/2023 FINDINGS: Gallbladder: Distended gallbladder. Small amount of dependent sludge. No shadowing stones. No wall thickening or adjacent fluid. No reported sonographic Murphy's sign. Common bile duct: Diameter:  8 mm, slightly enlarged. Liver: Diffusely echogenic hepatic parenchyma consistent with fatty liver infiltration. Portal vein is patent on color Doppler imaging with normal direction of blood flow towards the liver. Other: None. IMPRESSION: Dilated gallbladder with slight ectasia of the common duct. There is some sludge identified. On prior CT scan  there is a question of some dependent stones. These are not well seen on the current examination. However with the appearance overall would recommend further workup such as MRCP when clinically appropriate. Fatty liver infiltration. Electronically Signed   By: Adrianna Horde M.D.   On: 07/11/2023 16:46   CT ABDOMEN PELVIS W CONTRAST Result Date: 07/11/2023 EXAM:  CT ABDOMEN PELVIS WITH IV CONTRAST INDICATION:  Abdominal pain, acute, nonlocalized TECHNIQUE: Spiral CT scanning was performed through the abdomen and pelvis after the patient received IV contrast. COMPARISON: 10/15/2022 FINDINGS: There is no significant abnormality identified in the lung bases, spleen, and pancreas. Hepatic steatosis is present. Cholelithiasis is present. No renal calculus, obstruction, or soft tissue mass is present. There are no adrenal masses. The abdominal bowel loops are unremarkable. There is no evidence of ascites or adenopathy. No abdominal aortic aneurysm is present. 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 The bladder, prostate and seminal vesicles have a normal appearance. There is no inguinal hernia. There is mild sigmoid diverticulosis with no associated inflammation. The appendix has a normal appearance. There is no fracture or bone destruction. IMPRESSION: 1. No evidence of acute process. 2. Cholelithiasis. 3. Sigmoid diverticulosis. Please note that CT scanning at this site utilizes multiple dose reduction techniques, including automatic exposure control, adjustment of the MAA and/or KVP according to the patient's size, and use of iterative reconstruction. Electronically signed by: Buster Cash MD 07/11/2023 03:24 PM EDT RP Workstation: [id number]   DG Chest 2 View Result Date: 07/11/2023 CLINICAL DATA:  Chest pain and shortness of breath since Monday EXAM: CHEST - 2 VIEW COMPARISON:  10/14/2022 FINDINGS: Frontal and lateral views of the chest demonstrate a stable cardiac silhouette. Minimal left basilar scarring. No  acute airspace disease, effusion, or pneumothorax. No acute bony abnormality. IMPRESSION: 1. No acute intrathoracic process. Electronically Signed   By: Bobbye Burrow M.D.   On: 07/11/2023 14:43    Assessment / Plan:  53 year old male admitted with epigastric pain x 1 week with 1 episode of vomiting, elevated LFTs and lipase level. T. Bili 1.6 -> 1.4. Direct bili 1.6. Alk phos 187 -> 166. AST 299 -> 166. ALT 302 -> 463. Lipase > 2,800. CTAP showed cholelithiasis, hepatic steatosis and a normal pancreas. RUQ sonogram showed a distended gallbladder with dependent sludge with CBD measuring 8 mm without evidence of choledocholithiasis. Abdominal MRI/MRCP showed evidence of 2 CBD stones as reviewed by Dr. Kimble Pennant, final report per radiology pending. Patient has choledocholithiasis with biliary pancreatitis.  Afebrile.  Hemodynamically stable. - Clear liquid diet - NPO after midnight - ERCP tomorrow with Dr. Willy Harvest benefits and risks discussed including risk with sedation, risk of bleeding, perforation, pancreatitis and infection discussed with the patient and spouse - Pain management and IV fluids ( D5LR @ 125cc/hr)  per the hospitalist - CBC, CMP and lipase level in am - Await further recommendations per Dr. Willy Harvest  - General Surgery following, patient will need laparoscopic cholecystectomy during this hospital admission   History of PFO, followed by cardiology. TEE 2018 showed LVEF 55 to 60%, a patent foramen ovale. Doppler showed a trivial bidirectional, but predominantly left-to-right, atrial level shunt, in the baseline state.  Isolated atrial flutter 2018 not on [facility]  Principal Problem:   Gallstone pancreatitis Active Problems:   Hypercholesterolemia   History of atrial flutter     LOS: 1 day   Jon Hurst  07/12/2023, 3:23 PM    [facility] Attending   I have taken an interval history, reviewed the chart and examined the patient. I agree with the Advanced Practitioner's  note, impression and recommendations with the following additions:  I reviewed his situation with him and explained plans for and risks, benefits and indications of/for ERCP with sphincterotomy and stone extraction.  Kenney Peacemaker, MD, [facility] See Tilford Foley on call - gastroenterology for best contact person 07/12/2023 6:11 PM

## 2023-07-12 NOTE — Plan of Care (Signed)

## 2023-07-12 NOTE — Progress Notes (Signed)
  Progress Note   Patient: Jon Hurst [id number] DOB: [birth year] DOA: 07/11/2023     1 DOS: the patient was seen and examined on 07/12/2023   Brief hospital course: KENSINGTON DUERST is a 53 y.o. male with medical history significant for isolated episode of atrial flutter in 2018 not on anticoagulation, HLD who is admitted with acute gallstone pancreatitis.  Assessment and Plan: No notes have been filed under this hospital service. Service: Hospitalist     {Tip this will not be part of the note when signed Body mass index is 31.66 kg/m. , ,  (Optional):26781}  Subjective: ***  Physical Exam: Vitals:   07/11/23 2333 07/12/23 0426 07/12/23 0734 07/12/23 1209  BP: (!) 149/96 134/78 132/75 (!) 146/99  Pulse: 83 81 76 72  Resp: 18 18    Temp: 98.8 F (37.1 C) 98 F (36.7 C) 98.3 F (36.8 C) 98.2 F (36.8 C)  TempSrc:      SpO2: 98% 97% 95% 98%  Weight:      Height:       *** Data Reviewed: {Tip this will not be part of the note when signed- Document your independent interpretation of telemetry tracing, EKG, lab, Radiology test or any other diagnostic tests. Add any new diagnostic test ordered today. (Optional):26781} {Results:26384}  Family Communication: ***  Disposition: Status is: Inpatient {Inpatient:23812}  Planned Discharge Destination: {DISCHARGE DESTINATION_TRH:27031} {Tip this will not be part of the note when signed  DVT Prophylaxis  ., Scds  (Optional):26781}   Time spent: *** minutes  Author: Junita Oliva, DO 07/12/2023 6:33 PM  For on call review [email or web address].

## 2023-07-13 ENCOUNTER — Inpatient Hospital Stay (HOSPITAL_COMMUNITY)

## 2023-07-13 ENCOUNTER — Inpatient Hospital Stay (HOSPITAL_COMMUNITY): Admitting: Anesthesiology

## 2023-07-13 ENCOUNTER — Encounter (HOSPITAL_COMMUNITY): Payer: Self-pay | Admitting: Internal Medicine

## 2023-07-13 ENCOUNTER — Encounter (HOSPITAL_COMMUNITY)
Admission: EM | Disposition: A | Discharge status: Home / Self Care | Payer: Self-pay | Source: Home / Self Care | Attending: Internal Medicine

## 2023-07-13 DIAGNOSIS — K838 Other specified diseases of biliary tract: Secondary | ICD-10-CM

## 2023-07-13 DIAGNOSIS — K8020 Calculus of gallbladder without cholecystitis without obstruction: Secondary | ICD-10-CM

## 2023-07-13 DIAGNOSIS — K851 Biliary acute pancreatitis without necrosis or infection: Secondary | ICD-10-CM | POA: Diagnosis not present

## 2023-07-13 DIAGNOSIS — K8051 Calculus of bile duct without cholangitis or cholecystitis with obstruction: Secondary | ICD-10-CM | POA: Diagnosis not present

## 2023-07-13 DIAGNOSIS — K8050 Calculus of bile duct without cholangitis or cholecystitis without obstruction: Secondary | ICD-10-CM

## 2023-07-13 HISTORY — PX: ERCP: SHX5425

## 2023-07-13 LAB — COMPREHENSIVE METABOLIC PANEL WITH GFR
ALT: 356 U/L — ABNORMAL HIGH (ref 0–44)
AST: 204 U/L — ABNORMAL HIGH (ref 15–41)
Albumin: 3.5 g/dL (ref 3.5–5.0)
Alkaline Phosphatase: 179 U/L — ABNORMAL HIGH (ref 38–126)
Anion gap: 9 (ref 5–15)
BUN: 11 mg/dL (ref 6–20)
CO2: 26 mmol/L (ref 22–32)
Calcium: 8.6 mg/dL — ABNORMAL LOW (ref 8.9–10.3)
Chloride: 104 mmol/L (ref 98–111)
Creatinine, Ser: 1.09 mg/dL (ref 0.61–1.24)
GFR, Estimated: >60 mL/min (ref >60)
Glucose, Bld: 123 mg/dL — ABNORMAL HIGH (ref 70–99)
Potassium: 3.8 mmol/L (ref 3.5–5.1)
Sodium: 139 mmol/L (ref 135–145)
Total Bilirubin: 1.6 mg/dL — ABNORMAL HIGH (ref 0.0–1.2)
Total Protein: 6.7 g/dL (ref 6.5–8.1)

## 2023-07-13 LAB — LIPASE, BLOOD: Lipase: 139 U/L — ABNORMAL HIGH (ref 11–51)

## 2023-07-13 LAB — CBC
HCT: 42.9 % (ref 39.0–52.0)
Hemoglobin: 13.9 g/dL (ref 13.0–17.0)
MCH: 28.3 pg (ref 26.0–34.0)
MCHC: 32.4 g/dL (ref 30.0–36.0)
MCV: 87.4 fL (ref 80.0–100.0)
Platelets: 148 10*3/uL — ABNORMAL LOW (ref 150–400)
RBC: 4.91 MIL/uL (ref 4.22–5.81)
RDW: 14.4 % (ref 11.5–15.5)
WBC: 6.5 10*3/uL (ref 4.0–10.5)
nRBC: 0 % (ref 0.0–0.2)

## 2023-07-13 SURGERY — ERCP, WITH INTERVENTION IF INDICATED
Anesthesia: General

## 2023-07-13 MED ORDER — EPHEDRINE SULFATE-NACL 50-0.9 MG/10ML-% IV SOSY
PREFILLED_SYRINGE | INTRAVENOUS | Status: DC | PRN
Start: 2023-07-13 — End: 2023-07-13
  Administered 2023-07-13: 10 mg via INTRAVENOUS

## 2023-07-13 MED ORDER — DEXAMETHASONE SODIUM PHOSPHATE 10 MG/ML IJ SOLN
INTRAMUSCULAR | Status: DC | PRN
  Administered 2023-07-13: 10 mg via INTRAVENOUS

## 2023-07-13 MED ORDER — LIDOCAINE 2% (20 MG/ML) 5 ML SYRINGE
INTRAMUSCULAR | Status: DC | PRN
Start: 2023-07-13 — End: 2023-07-13
  Administered 2023-07-13: 60 mg via INTRAVENOUS

## 2023-07-13 MED ORDER — LACTATED RINGERS IV SOLN
INTRAVENOUS | Status: AC | PRN
  Administered 2023-07-13: 1000 mL via INTRAVENOUS

## 2023-07-13 MED ORDER — SUGAMMADEX SODIUM 200 MG/2ML IV SOLN
INTRAVENOUS | Status: DC | PRN
Start: 2023-07-13 — End: 2023-07-13
  Administered 2023-07-13: 220 mg via INTRAVENOUS

## 2023-07-13 MED ORDER — DICLOFENAC SUPPOSITORY 100 MG
100.0000 mg | Freq: Once | RECTAL | Status: DC
Start: 2023-07-13 — End: 2023-07-15

## 2023-07-13 MED ORDER — ROCURONIUM BROMIDE 10 MG/ML (PF) SYRINGE
PREFILLED_SYRINGE | INTRAVENOUS | Status: DC | PRN
  Administered 2023-07-13: 40 mg via INTRAVENOUS
  Administered 2023-07-13: 30 mg via INTRAVENOUS

## 2023-07-13 MED ORDER — FENTANYL CITRATE (PF) 100 MCG/2ML IJ SOLN
INTRAMUSCULAR | Status: AC
Start: 2023-07-13 — End: 2023-07-13
  Filled 2023-07-13: qty 2

## 2023-07-13 MED ORDER — ONDANSETRON HCL 4 MG/2ML IJ SOLN
INTRAMUSCULAR | Status: DC | PRN
  Administered 2023-07-13: 4 mg via INTRAVENOUS

## 2023-07-13 MED ORDER — GLUCAGON HCL RDNA (DIAGNOSTIC) 1 MG IJ SOLR
INTRAMUSCULAR | Status: AC
  Filled 2023-07-13: qty 1

## 2023-07-13 MED ORDER — CIPROFLOXACIN IN D5W 400 MG/200ML IV SOLN
INTRAVENOUS | Status: AC
  Filled 2023-07-13: qty 200

## 2023-07-13 MED ORDER — DICLOFENAC SUPPOSITORY 100 MG
RECTAL | Status: AC
  Filled 2023-07-13: qty 1

## 2023-07-13 MED ORDER — SODIUM CHLORIDE 0.9 % IV SOLN
INTRAVENOUS | Status: DC | PRN
  Administered 2023-07-13: 25 mL

## 2023-07-13 MED ORDER — SODIUM CHLORIDE 0.9 % IV SOLN: INTRAVENOUS | Status: DC

## 2023-07-13 MED ORDER — FENTANYL CITRATE (PF) 100 MCG/2ML IJ SOLN
INTRAMUSCULAR | Status: DC | PRN
  Administered 2023-07-13: 100 ug via INTRAVENOUS

## 2023-07-13 MED ORDER — DICLOFENAC SUPPOSITORY 100 MG
RECTAL | Status: DC | PRN
  Administered 2023-07-13: 100 mg via RECTAL

## 2023-07-13 MED ORDER — CIPROFLOXACIN IN D5W 400 MG/200ML IV SOLN
INTRAVENOUS | Status: DC | PRN
Start: 2023-07-13 — End: 2023-07-13
  Administered 2023-07-13: 400 mg via INTRAVENOUS

## 2023-07-13 MED ORDER — PROPOFOL 10 MG/ML IV BOLUS
INTRAVENOUS | Status: DC | PRN
  Administered 2023-07-13: 200 mg via INTRAVENOUS

## 2023-07-13 NOTE — Interval H&P Note (Signed)
 History and Physical Interval Note:  07/13/2023 12:55 PM  Jon Hurst  has presented today for surgery, with the diagnosis of choledocholithiasis, elelvated LFTs and lipase.  The various methods of treatment have been discussed with the patient and family. After consideration of risks, benefits and other options for treatment, the patient has consented to  Procedure(s): ERCP, WITH INTERVENTION IF INDICATED (N/A) as a surgical intervention.  [name] history has been reviewed, patient examined, no change in status, stable for surgery.  I have reviewed the patient's chart and labs.  Questions were answered to the patient's satisfaction.     Loy Ruff

## 2023-07-13 NOTE — Progress Notes (Signed)
  Progress Note   Patient: Jon Hurst FMW:[id number] DOB: [birth year] DOA: 07/11/2023     2 DOS: the patient was seen and examined on 07/13/2023   Brief hospital course: Jon Hurst is a 53 y.o. male with medical history significant for isolated episode of atrial flutter in 2018 not on anticoagulation, HLD who is admitted with acute gallstone pancreatitis. MRCP demonstrated cholecystitis and cholellithiasis. GI has been consulted and will take the patient for ERCP today. General surgery has also been consulted for cholecystitis following ERCP.  Assessment and Plan: Acute choledocholithiasis/Acute gallstone pancreatitis Plan is for ERCP on 07/13/2023 and Cholecystectomy to follow.  Hyperlipidemia: Holding statin.   History of isolated atrial flutter: Follows with cardiology.  Per their documentation patient had isolated atrial flutter in 2018 without recurrence.  Not on chronic anticoagulation.   Diarrhea None since admission.     Subjective: The patient is resting comfortably. No new complaints.   Physical Exam: Vitals:   07/13/23 1212 07/13/23 1508 07/13/23 1516 07/13/23 1526  BP: (!) 141/91 (!) 144/85 (!) 140/59 129/69  Pulse: 66 83 77 73  Resp: 10 10 14 10   Temp: (!) 97.5 F (36.4 C) 98.1 F (36.7 C)    TempSrc: Temporal Temporal    SpO2: 99% 98% 98% 97%  Weight: 108.9 kg     Height: 6' 1 (1.854 m)      Exam:  Constitutional:  The patient is awake, alert, and oriented x 3. No acute distress. Eyes:  pupils and irises appear normal Normal lids and conjunctivae ENMT:  grossly normal hearing  Lips appear normal external ears, nose appear normal Oropharynx: mucosa, tongue,posterior pharynx appear normal Neck:  neck appears normal, no masses, normal ROM, supple no thyromegaly Respiratory:  No increased work of breathing. No wheezes, rales, or rhonchi No tactile fremitus Cardiovascular:  Regular rate and rhythm No murmurs, ectopy, or gallups. No lateral  PMI. No thrills. Abdomen:  Abdomen is soft, non-distended Positive for RUQ and epigastric tenderness with palpation No hernias, masses, or organomegaly Normoactive bowel sounds.  Musculoskeletal:  No cyanosis, clubbing, or edema Skin:  No rashes, lesions, ulcers palpation of skin: no induration or nodules Neurologic:  CN 2-12 intact Sensation all 4 extremities intact Psychiatric:  Mental status Mood, affect appropriate Orientation to person, place, time  judgment and insight appear intact  Data Reviewed:  CT abdomen and pelvis MRCP CBC CMP  Family Communication: Spouse was at bedside. All questions answered to the best of my ability.  Disposition: Status is: Inpatient Remains inpatient appropriate because: Need for ERCP and Cholecystectomy for acute gallstone pancreatitis due to cholecodocholithiasis.  Planned Discharge Destination: Home    Time spent: 30 minutes  Author: Philana Younis, DO 07/13/2023 7:22 PM  For on call review [email or web address].

## 2023-07-13 NOTE — Addendum Note (Signed)
 Addendum  created 07/13/23 1650 by Juluis Ok, CRNA   Flowsheet accepted, Intraprocedure Flowsheets edited

## 2023-07-13 NOTE — Transfer of Care (Signed)
 Immediate Anesthesia Transfer of Care Note  Patient: Jon Hurst  Procedure(s) Performed: ERCP, WITH INTERVENTION IF INDICATED  Patient Location: Endoscopy Unit  Anesthesia Type:General  Level of Consciousness: awake and patient cooperative  Airway & Oxygen Therapy: Patient Spontanous Breathing and Patient connected to nasal cannula oxygen  Post-op Assessment: Report given to RN and Post -op Vital signs reviewed and stable  Post vital signs: Reviewed and stable  Last Vitals:  Vitals Value Taken Time  BP 147/79 07/13/23 15:10  Temp 36.7 C 07/13/23 15:08  Pulse 84 07/13/23 15:12  Resp 10 07/13/23 15:12  SpO2 97 % 07/13/23 15:12  Vitals shown include unfiled device data.  Last Pain:  Vitals:   07/13/23 1508  TempSrc: Temporal  PainSc: 0-No pain         Complications: No notable events documented.

## 2023-07-13 NOTE — Anesthesia Procedure Notes (Signed)
 Procedure Name: Intubation Date/Time: 07/13/2023 2:13 PM  Performed by: Juluis Ok, CRNAPre-anesthesia Checklist: Patient identified, Emergency Drugs available, Suction available and Patient being monitored Patient Re-evaluated:Patient Re-evaluated prior to induction Oxygen Delivery Method: Circle system utilized Preoxygenation: Pre-oxygenation with 100% oxygen Induction Type: IV induction Ventilation: Mask ventilation without difficulty and Two handed mask ventilation required Laryngoscope Size: Glidescope and 3 Grade View: Grade I Tube size: 7.5 mm Number of attempts: 1 Airway Equipment and Method: Video-laryngoscopy and Rigid stylet Placement Confirmation: ETT inserted through vocal cords under direct vision, CO2 detector, breath sounds checked- equal and bilateral and positive ETCO2 Secured at: 23 (OETT positioned 23 cm at lower lip.) cm Tube secured with: Tape Dental Injury: Teeth and Oropharynx as per pre-operative assessment  Comments: Direct laryngoscopy x 1 revealed anterior view. Converted to Glidescope. Atraumatic intubation x 1 with Glidescope size 3 blade. Lips and teeth remain in preoperative condition.

## 2023-07-13 NOTE — Anesthesia Preprocedure Evaluation (Addendum)
 Anesthesia Evaluation  Patient identified by MRN, date of birth, ID band Patient awake    Reviewed: Allergy & Precautions, H&P , NPO status , Patient's Chart, lab work & pertinent test results  Airway Mallampati: II  TM Distance: >3 FB Neck ROM: Full    Dental no notable dental hx. (+) Teeth Intact, Dental Advisory Given   Pulmonary former smoker   Pulmonary exam normal breath sounds clear to auscultation       Cardiovascular + dysrhythmias  Rhythm:Regular Rate:Normal  + PFO   Neuro/Psych negative neurological ROS  negative psych ROS   GI/Hepatic negative GI ROS, Neg liver ROS,,,  Endo/Other  negative endocrine ROS    Renal/GU negative Renal ROS  negative genitourinary   Musculoskeletal   Abdominal   Peds  Hematology negative hematology ROS (+)   Anesthesia Other Findings   Reproductive/Obstetrics negative OB ROS                             Anesthesia Physical Anesthesia Plan  ASA: 2  Anesthesia Plan: General   Post-op Pain Management: Minimal or no pain anticipated   Induction: Intravenous  PONV Risk Score and Plan: 2 and Ondansetron and Dexamethasone  Airway Management Planned: Oral ETT  Additional Equipment:   Intra-op Plan:   Post-operative Plan: Extubation in OR  Informed Consent: I have reviewed the patients History and Physical, chart, labs and discussed the procedure including the risks, benefits and alternatives for the proposed anesthesia with the patient or authorized representative who has indicated his/her understanding and acceptance.     Dental advisory given  Plan Discussed with: CRNA  Anesthesia Plan Comments:        Anesthesia Quick Evaluation

## 2023-07-13 NOTE — Op Note (Signed)
 [facility] Patient Name: Jon Hurst Procedure Date: 07/13/2023 MRN: [id number] Attending MD: Kenney Peacemaker , MD, [id number] Date of Birth: 03-09-1970 CSN: [id number] Age: 53 Admit Type: Inpatient Procedure:                ERCP Indications:              Bile duct stone(s), For therapy of bile duct                            stone(s). Patient had gallstone pancreatitis which                            has resolved. Providers:                Kenney Peacemaker, MD, Marden Shaggy, RN, Jacquelyn                            Jaci Pierce, RN Referring MD:             [facility] and [facility] surgery Medicines:                General Anesthesia, Cipro 400 mg IV, Diclofenac 100                            mg per rectum Complications:            No immediate complications. Estimated Blood Loss:     Estimated blood loss: none. Procedure:                Pre-Anesthesia Assessment:                           - Prior to the procedure, a History and Physical                            was performed, and patient medications and                            allergies were reviewed. [name] tolerance of                            previous anesthesia was also reviewed. The risks                            and benefits of the procedure and the sedation                            options and risks were discussed with the patient.                            All questions were answered, and informed consent                            was obtained. Prior Anticoagulants: The patient has  taken no anticoagulant or antiplatelet agents. ASA                            Grade Assessment: II - A patient with mild systemic                            disease. After reviewing the risks and benefits,                            the patient was deemed in satisfactory condition to                            undergo the procedure.                           After  obtaining informed consent, the scope was                            passed under direct vision. Throughout the                            procedure, the patient's blood pressure, pulse, and                            oxygen saturations were monitored continuously. The                            [employer] D single use                            duodenoscope was introduced through the mouth, and                            used to inject contrast into and used to inject                            contrast into the bile duct. The ERCP was                            accomplished without difficulty. The patient                            tolerated the procedure well. Scope In: Scope Out: Findings:      The scout film was normal. The esophagus was successfully intubated       under direct vision. The scope was advanced to a normal major papilla in       the descending duodenum without detailed examination of the pharynx,       larynx and associated structures, and upper GI tract. The upper GI tract       was grossly normal. The esophagus was not seen well at all due to the       side-viewing nature of the scope. The bile duct was deeply cannulated       with the traction (standard) sphincterotome using wire-guided  technique.       [employer] Rx 44 Hydratome with Hydro Jagwire used (035 wire)       Contrast was injected. I personally interpreted the bile duct images.       Ductal flow of contrast was adequate. The common bile duct contained       three stones, the largest of which was 7 mm in diameter. The main bile       duct was mildly dilated, due to stones causing an obstruction. A 5 mm       biliary sphincterotomy was made with a traction (standard)       sphincterotome using ERBE electrocautery. There was no       post-sphincterotomy bleeding. The biliary tree was swept with a 12 mm       balloon starting at the bifurcation. All stones were removed. Some came        out intact some came out as sludge. Multiple sweeps and occlusion       cholangiogram negative. There were some filling defects suspected to be       air bubbles in the intrahepatics but with further filling these       dissipated confirming that they were air bubbles. The gallbladder did       fill and showed gallstones. The pancreas was not entered by intent. Impression:               - The entire main bile duct was mildly dilated, due                            to stones causing an obstruction.                           - Choledocholithiasis was found. Complete removal                            was accomplished by biliary sphincterotomy and                            balloon extraction.                           - A biliary sphincterotomy was performed.                           - The biliary tree was swept. All 3 stones removed.                           - Cholelithiasis Moderate Sedation:      Not Applicable - Patient had care per Anesthesia. Recommendation:           - Return to floor                           Clear liquid diet today and n.p.o. after midnight                           Current plan is for surgery to perform  cholecystectomy tomorrow if he is doing well.                           LFT recheck AM (they could increase some after                            manipulation)                           Wife updated via phone Procedure Code(s):        --- Professional ---                           [phone], Endoscopic retrograde                            cholangiopancreatography (ERCP); with removal of                            calculi/debris from biliary/pancreatic duct(s)                           43262, Endoscopic retrograde                            cholangiopancreatography (ERCP); with                            sphincterotomy/papillotomy                           [phone], Endoscopic catheterization of the biliary                             ductal system, radiological supervision and                            interpretation Diagnosis Code(s):        --- Professional ---                           K80.51, Calculus of bile duct without cholangitis                            or cholecystitis with obstruction CPT copyright 2022 [employer]. All rights reserved. The codes documented in this report are preliminary and upon coder review may  be revised to meet current compliance requirements. Kenney Peacemaker, MD 07/13/2023 3:19:50 PM This report has been signed electronically. Number of Addenda: 0

## 2023-07-13 NOTE — Anesthesia Postprocedure Evaluation (Signed)
 Anesthesia Post Note  Patient: Jon Hurst  Procedure(s) Performed: ERCP, WITH INTERVENTION IF INDICATED     Patient location during evaluation: Endoscopy Anesthesia Type: General Level of consciousness: awake and alert Pain management: pain level controlled Vital Signs Assessment: post-procedure vital signs reviewed and stable Respiratory status: spontaneous breathing, nonlabored ventilation and respiratory function stable Cardiovascular status: blood pressure returned to baseline and stable Postop Assessment: no apparent nausea or vomiting Anesthetic complications: no  No notable events documented.  Last Vitals:  Vitals:   07/13/23 1508 07/13/23 1516  BP: (!) 144/85 (!) 140/59  Pulse: 83 77  Resp: 10 14  Temp: 36.7 C   SpO2: 98% 98%    Last Pain:  Vitals:   07/13/23 1516  TempSrc:   PainSc: 0-No pain                 Stacee Earp,W. EDMOND

## 2023-07-13 NOTE — Progress Notes (Signed)
 Subjective/Chief Complaint: Significant pain overnight requiring medication; feeling better this morning   Objective: Vital signs in last 24 hours: Temp:  [97.8 F (36.6 C)-98.4 F (36.9 C)] 98.4 F (36.9 C) (06/20 0432) Pulse Rate:  [63-72] 64 (06/20 0432) Resp:  [17-18] 18 (06/20 0432) BP: (137-146)/(81-99) 141/81 (06/20 0432) SpO2:  [98 %-99 %] 99 % (06/20 0432) Last BM Date : 07/11/23  Intake/Output from previous day: 06/19 0701 - 06/20 0700 In: 120 [P.O.:120] Out: -  Intake/Output this shift: No intake/output data recorded.  Alert and well-appearing Labored respirations Abdomen soft, currently nontender  Lab Results:  Recent Labs    07/12/23 0431 07/13/23 0426  WBC 9.3 6.5  HGB 14.6 13.9  HCT 44.6 42.9  PLT 175 148*   BMET Recent Labs    07/12/23 0431 07/13/23 0426  NA 136 139  K 3.8 3.8  CL 100 104  CO2 24 26  GLUCOSE 136* 123*  BUN 13 11  CREATININE 1.07 1.09  CALCIUM 9.1 8.6*   PT/INR No results for input(s): LABPROT, INR in the last 72 hours. ABG No results for input(s): PHART, HCO3 in the last 72 hours.  Invalid input(s): PCO2, PO2  Studies/Results: MR ABDOMEN MRCP W WO CONTAST Result Date: 07/12/2023 CLINICAL DATA:  Acute pancreatitis with right upper quadrant abdominal pain EXAM: MRI ABDOMEN WITHOUT AND WITH CONTRAST (INCLUDING MRCP) TECHNIQUE: Multiplanar multisequence MR imaging of the abdomen was performed both before and after the administration of intravenous contrast. Heavily T2-weighted images of the biliary and pancreatic ducts were obtained. Post-processing was applied at the acquisition scanner with concurrent physician supervision which includes 3D reconstructions, MIPs, volume rendered images and/or shaded surface rendering. CONTRAST:  10mL GADAVIST GADOBUTROL 1 MMOL/ML IV SOLN COMPARISON:  Abdominal ultrasound examination and CT abdomen and pelvis dated 07/11/2023 FINDINGS: Lower chest: No acute findings.  Hepatobiliary: Hepatic steatosis. No intrahepatic bile duct dilation. Common bile duct measures 6 mm in diameter with multiple filling defects within the downstream duct. Cholelithiasis. Pancreas: No mass, inflammatory changes, or other parenchymal abnormality identified. Spleen:  Within normal limits in size and appearance. Adrenals/Urinary Tract: No adrenal nodules. No suspicious renal masses identified. No evidence of hydronephrosis. Stomach/Bowel: Small duodenal diverticulum arising from the third portion of the duodenum. Normal appendix. Vascular/Lymphatic: No pathologically enlarged lymph nodes identified. No abdominal aortic aneurysm demonstrated. Left-sided infrarenal IVC. Other:  None. Musculoskeletal: No suspicious bone lesions identified. IMPRESSION: 1. Cholelithiasis and choledocholithiasis. No intrahepatic bile duct dilation. 2. Hepatic steatosis. 3. No MR evidence of acute pancreatitis. Electronically Signed   By: Limin  Xu M.D.   On: 07/12/2023 15:47   MR 3D Recon At Scanner Result Date: 07/12/2023 CLINICAL DATA:  Acute pancreatitis with right upper quadrant abdominal pain EXAM: MRI ABDOMEN WITHOUT AND WITH CONTRAST (INCLUDING MRCP) TECHNIQUE: Multiplanar multisequence MR imaging of the abdomen was performed both before and after the administration of intravenous contrast. Heavily T2-weighted images of the biliary and pancreatic ducts were obtained. Post-processing was applied at the acquisition scanner with concurrent physician supervision which includes 3D reconstructions, MIPs, volume rendered images and/or shaded surface rendering. CONTRAST:  10mL GADAVIST GADOBUTROL 1 MMOL/ML IV SOLN COMPARISON:  Abdominal ultrasound examination and CT abdomen and pelvis dated 07/11/2023 FINDINGS: Lower chest: No acute findings. Hepatobiliary: Hepatic steatosis. No intrahepatic bile duct dilation. Common bile duct measures 6 mm in diameter with multiple filling defects within the downstream duct.  Cholelithiasis. Pancreas: No mass, inflammatory changes, or other parenchymal abnormality identified. Spleen:  Within normal limits in size  and appearance. Adrenals/Urinary Tract: No adrenal nodules. No suspicious renal masses identified. No evidence of hydronephrosis. Stomach/Bowel: Small duodenal diverticulum arising from the third portion of the duodenum. Normal appendix. Vascular/Lymphatic: No pathologically enlarged lymph nodes identified. No abdominal aortic aneurysm demonstrated. Left-sided infrarenal IVC. Other:  None. Musculoskeletal: No suspicious bone lesions identified. IMPRESSION: 1. Cholelithiasis and choledocholithiasis. No intrahepatic bile duct dilation. 2. Hepatic steatosis. 3. No MR evidence of acute pancreatitis. Electronically Signed   By: Limin  Xu M.D.   On: 07/12/2023 15:47   US  Abdomen Limited RUQ (LIVER/GB) Result Date: 07/11/2023 CLINICAL DATA:  Epigastric pain for a week. Elevated lipase and amylase EXAM: ULTRASOUND ABDOMEN LIMITED RIGHT UPPER QUADRANT COMPARISON:  CT 07/11/2023 FINDINGS: Gallbladder: Distended gallbladder. Small amount of dependent sludge. No shadowing stones. No wall thickening or adjacent fluid. No reported sonographic Murphy's sign. Common bile duct: Diameter: 8 mm, slightly enlarged. Liver: Diffusely echogenic hepatic parenchyma consistent with fatty liver infiltration. Portal vein is patent on color Doppler imaging with normal direction of blood flow towards the liver. Other: None. IMPRESSION: Dilated gallbladder with slight ectasia of the common duct. There is some sludge identified. On prior CT scan there is a question of some dependent stones. These are not well seen on the current examination. However with the appearance overall would recommend further workup such as MRCP when clinically appropriate. Fatty liver infiltration. Electronically Signed   By: Adrianna Horde M.D.   On: 07/11/2023 16:46   CT ABDOMEN PELVIS W CONTRAST Result Date: 07/11/2023 EXAM:   CT ABDOMEN PELVIS WITH IV CONTRAST INDICATION:  Abdominal pain, acute, nonlocalized TECHNIQUE: Spiral CT scanning was performed through the abdomen and pelvis after the patient received IV contrast. COMPARISON: 10/15/2022 FINDINGS: There is no significant abnormality identified in the lung bases, spleen, and pancreas. Hepatic steatosis is present. Cholelithiasis is present. No renal calculus, obstruction, or soft tissue mass is present. There are no adrenal masses. The abdominal bowel loops are unremarkable. There is no evidence of ascites or adenopathy. No abdominal aortic aneurysm is present. 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 The bladder, prostate and seminal vesicles have a normal appearance. There is no inguinal hernia. There is mild sigmoid diverticulosis with no associated inflammation. The appendix has a normal appearance. There is no fracture or bone destruction. IMPRESSION: 1. No evidence of acute process. 2. Cholelithiasis. 3. Sigmoid diverticulosis. Please note that CT scanning at this site utilizes multiple dose reduction techniques, including automatic exposure control, adjustment of the MAA and/or KVP according to the patient's size, and use of iterative reconstruction. Electronically signed by: Buster Cash MD 07/11/2023 03:24 PM EDT RP Workstation: [id number]   DG Chest 2 View Result Date: 07/11/2023 CLINICAL DATA:  Chest pain and shortness of breath since Monday EXAM: CHEST - 2 VIEW COMPARISON:  10/14/2022 FINDINGS: Frontal and lateral views of the chest demonstrate a stable cardiac silhouette. Minimal left basilar scarring. No acute airspace disease, effusion, or pneumothorax. No acute bony abnormality. IMPRESSION: 1. No acute intrathoracic process. Electronically Signed   By: Bobbye Burrow M.D.   On: 07/11/2023 14:43    Anti-infectives: Anti-infectives (From admission, onward)    None       Assessment/Plan:      Attestation signed by Adalberto Acton, MD at 07/12/2023   4:22 PM   I personally saw the patient and performed a substantive portion of the medical decision making, in conjunction with the Advanced Practice provider Berkeley Breath, PA-C for the treatment of choledocholithiasis.   I personally  reviewed images of CT scan, ultrasound, and MRCP showing Cholelithiasis and choledocholithiasis. I reviewed recent lab values, vitals, and notes. LFTs demonstrate held alk phos and bilirubin elevation and moderate AST/ALT elevation; lipase yesterday was greater than 2800.  No leukocytosis. He is afebrile.  Abdomen is currently nontender, nondistended.   Appreciate GI evaluation and plans for ERCP tomorrow.    I reviewed the relevant anatomy and surgical technique with him and we discussed risks including bleeding, infection, pain, scarring, injury to intra-abdominal structures including the common bile duct and sequelae, conversion to open surgery or subtotal cholecystectomy, risk of bile leak, as well as cardiovascular/ pulmonary/ thromboembolic risks.  Questions were welcomed and answered to his satisfaction.  Surgery team will follow results of ERCP tomorrow and will tentatively plan for laparoscopic cholecystectomy on Saturday morning.   This care required high  level of medical decision making.          Expand All Collapse All            Consult Note   Jon Hurst May 02, 1970  [id number].     Requesting MD: Junita Oliva, DO Chief Complaint/Reason for Consult: Biliary pancreatitis  HPI:  Patient is a 53 year old male who presented to the ED yesterday with epigastric abdominal pain. Pain started about 1 week prior to presentation and initially was intermittent and seemed to come on shortly after eating. Over that time symptoms started becoming more frequent and yesterday became more severe. Increased pain with deep inspiration yesterday as well. Did have some nausea yesterday and vomited once after CT. Last BM was this AM and was a little loose. Denied  fever, chills. Currently symptoms are resolved. PMH otherwise significant for PFO and isolated episode of atrial flutter. Not on blood thinners and NKDA. No prior abdominal surgery. Patient's wife present at bedside for interview and exam.    ROS:   Negative other than HPI        Family History  Problem Relation Age of Onset   Heart failure Mother     Colon polyps Father     COPD Father     Colon cancer Father     Esophageal cancer Neg Hx     Stomach cancer Neg Hx     Rectal cancer Neg Hx                Past Medical History:  Diagnosis Date   Paroxysmal atrial flutter (HCC)     PFO (patent foramen ovale) 11/02/2016               Past Surgical History:  Procedure Laterality Date   ANTERIOR CRUCIATE LIGAMENT REPAIR       SHOULDER ACROMIOPLASTY       TEE WITHOUT CARDIOVERSION N/A 11/02/2016    Procedure: TRANSESOPHAGEAL ECHOCARDIOGRAM (TEE);  Surgeon: Luana Rumple, MD;  Location: [facility] ENDOSCOPY;  Service: Cardiovascular;  Laterality: N/A;          Social History:  reports that he quit smoking about 7 years ago. His smoking use included cigarettes. He has never used smokeless tobacco. He reports current alcohol  use. He reports that he does not use drugs.   Allergies:  Allergies  No Known Allergies           Medications Prior to Admission  Medication Sig Dispense Refill   Misc Natural Products (BEET ROOT PO) Take 1 tablet by mouth daily.       pantoprazole  (PROTONIX ) 40 MG tablet Take 1 tablet (40 mg  total) by mouth daily. 30 tablet 0   rosuvastatin (CRESTOR) 10 MG tablet Take 10 mg by mouth daily. (Patient not taking: Reported on 07/11/2023)              Blood pressure (!) 146/99, pulse 72, temperature 98.2 F (36.8 C), resp. rate 18, height 6' 1 (1.854 m), weight 108.9 kg, SpO2 98%. Physical Exam:  General: pleasant, WD, obese male who is laying in bed in NAD HEENT: head is normocephalic, atraumatic.  Sclera are anicteric.  Ears and nose without any masses  or lesions.  Mouth is pink and moist Heart: regular, rate, and rhythm.  Palpable radial and pedal pulses bilaterally Lungs: No wheezes, rhonchi, or rales noted.  Respiratory effort nonlabored Abd: soft, NT, ND, no masses, hernias, or organomegaly MS: all 4 extremities are symmetrical with no cyanosis, clubbing, or edema. Skin: warm and dry with no masses, lesions, or rashes Neuro: Cranial nerves 2-12 grossly intact, sensation is normal throughout Psych: A&Ox3 with an appropriate affect.     Lab Results Last 48 Hours        Results for orders placed or performed during the hospital encounter of 07/11/23 (from the past 48 hours)  Basic metabolic panel     Status: Abnormal    Collection Time: 07/11/23 12:07 PM  Result Value Ref Range    Sodium 139 135 - 145 mmol/L    Potassium 3.8 3.5 - 5.1 mmol/L    Chloride 103 98 - 111 mmol/L    CO2 25 22 - 32 mmol/L    Glucose, Bld 129 (H) 70 - 99 mg/dL      Comment: Glucose reference range applies only to samples taken after fasting for at least 8 hours.    BUN 10 6 - 20 mg/dL    Creatinine, Ser 7.56 0.61 - 1.24 mg/dL    Calcium 9.7 8.9 - 43.3 mg/dL    GFR, Estimated >29 >51 mL/min      Comment: (NOTE) Calculated using the CKD-EPI Creatinine Equation (2021)      Anion gap 12 5 - 15      Comment: Performed at [facility], [address] [place], [place] [address]  CBC     Status: None    Collection Time: 07/11/23 12:07 PM  Result Value Ref Range    WBC 10.0 4.0 - 10.5 K/uL    RBC 5.39 4.22 - 5.81 MIL/uL    Hemoglobin 15.0 13.0 - 17.0 g/dL    HCT 60.6 30.1 - 60.1 %    MCV 81.3 80.0 - 100.0 fL    MCH 27.8 26.0 - 34.0 pg    MCHC 34.2 30.0 - 36.0 g/dL    RDW 09.3 23.5 - 57.3 %    Platelets 185 150 - 400 K/uL    nRBC 0.0 0.0 - 0.2 %      Comment: Performed at [facility], [address] [place], [place] [address]  Troponin T, High Sensitivity     Status: None    Collection Time: 07/11/23 12:07 PM  Result Value Ref  Range    Troponin T High Sensitivity <15 <19 ng/L      Comment: (NOTE) Biotin concentrations > 1000 ng/mL falsely decrease TnT results.  Serial cardiac troponin measurements are suggested.   Refer to the Links section for chest pain algorithms and additional  guidance. Performed at [facility], [address], [place], [place] [address]    Hepatic  function panel     Status: Abnormal    Collection Time: 07/11/23 12:13 PM  Result Value Ref Range    Total Protein 7.3 6.5 - 8.1 g/dL    Albumin 4.3 3.5 - 5.0 g/dL    AST 409 (H) 15 - 41 U/L    ALT 302 (H) 0 - 44 U/L    Alkaline Phosphatase 187 (H) 38 - 126 U/L    Total Bilirubin 1.6 (H) 0.0 - 1.2 mg/dL    Bilirubin, Direct 1.1 (H) 0.0 - 0.2 mg/dL    Indirect Bilirubin 0.5 0.3 - 0.9 mg/dL      Comment: Performed at [facility], [address] [place], [place] [address]  Lipase, blood     Status: Abnormal    Collection Time: 07/11/23 12:13 PM  Result Value Ref Range    Lipase >2,800 (H) 11 - 51 U/L      Comment: Performed at [facility], [address] [place], [place] [address]  Troponin T, High Sensitivity     Status: None    Collection Time: 07/11/23  1:37 PM  Result Value Ref Range    Troponin T High Sensitivity <15 <19 ng/L      Comment: (NOTE) Biotin concentrations > 1000 ng/mL falsely decrease TnT results.  Serial cardiac troponin measurements are suggested.   Refer to the Links section for chest pain algorithms and additional  guidance. Performed at [facility], [address] [place], [place] [address]    Comprehensive metabolic panel     Status: Abnormal    Collection Time: 07/12/23  4:31 AM  Result Value Ref Range    Sodium 136 135 - 145 mmol/L    Potassium 3.8 3.5 - 5.1 mmol/L    Chloride 100 98 - 111 mmol/L    CO2 24 22 - 32 mmol/L    Glucose, Bld 136 (H) 70 - 99 mg/dL      Comment: Glucose reference range applies only to samples taken after fasting for at least  8 hours.    BUN 13 6 - 20 mg/dL    Creatinine, Ser 0.86 0.61 - 1.24 mg/dL    Calcium 9.1 8.9 - 57.8 mg/dL    Total Protein 7.2 6.5 - 8.1 g/dL    Albumin 3.9 3.5 - 5.0 g/dL    AST 469 (H) 15 - 41 U/L    ALT 463 (H) 0 - 44 U/L    Alkaline Phosphatase 166 (H) 38 - 126 U/L    Total Bilirubin 1.4 (H) 0.0 - 1.2 mg/dL    GFR, Estimated >62 >95 mL/min      Comment: (NOTE) Calculated using the CKD-EPI Creatinine Equation (2021)      Anion gap 12 5 - 15      Comment: Performed at [facility], [address], [place], [place] [address]  CBC     Status: None    Collection Time: 07/12/23  4:31 AM  Result Value Ref Range    WBC 9.3 4.0 - 10.5 K/uL    RBC 5.19 4.22 - 5.81 MIL/uL    Hemoglobin 14.6 13.0 - 17.0 g/dL    HCT 24.4 01.0 - 27.2 %    MCV 85.9 80.0 - 100.0 fL    MCH 28.1 26.0 - 34.0 pg    MCHC 32.7 30.0 - 36.0 g/dL    RDW 53.6 64.4 - 03.4 %    Platelets 175 150 - 400  K/uL    nRBC 0.0 0.0 - 0.2 %      Comment: Performed at [facility], [address], [place], [place] [address]       Imaging Results (Last 48 hours)  US  Abdomen Limited RUQ (LIVER/GB) Result Date: 07/11/2023 CLINICAL DATA:  Epigastric pain for a week. Elevated lipase and amylase EXAM: ULTRASOUND ABDOMEN LIMITED RIGHT UPPER QUADRANT COMPARISON:  CT 07/11/2023 FINDINGS: Gallbladder: Distended gallbladder. Small amount of dependent sludge. No shadowing stones. No wall thickening or adjacent fluid. No reported sonographic Murphy's sign. Common bile duct: Diameter: 8 mm, slightly enlarged. Liver: Diffusely echogenic hepatic parenchyma consistent with fatty liver infiltration. Portal vein is patent on color Doppler imaging with normal direction of blood flow towards the liver. Other: None. IMPRESSION: Dilated gallbladder with slight ectasia of the common duct. There is some sludge identified. On prior CT scan there is a question of some dependent stones. These are not well seen on the  current examination. However with the appearance overall would recommend further workup such as MRCP when clinically appropriate. Fatty liver infiltration. Electronically Signed   By: Adrianna Horde M.D.   On: 07/11/2023 16:46    CT ABDOMEN PELVIS W CONTRAST Result Date: 07/11/2023 EXAM:  CT ABDOMEN PELVIS WITH IV CONTRAST INDICATION:  Abdominal pain, acute, nonlocalized TECHNIQUE: Spiral CT scanning was performed through the abdomen and pelvis after the patient received IV contrast. COMPARISON: 10/15/2022 FINDINGS: There is no significant abnormality identified in the lung bases, spleen, and pancreas. Hepatic steatosis is present. Cholelithiasis is present. No renal calculus, obstruction, or soft tissue mass is present. There are no adrenal masses. The abdominal bowel loops are unremarkable. There is no evidence of ascites or adenopathy. No abdominal aortic aneurysm is present. 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 The bladder, prostate and seminal vesicles have a normal appearance. There is no inguinal hernia. There is mild sigmoid diverticulosis with no associated inflammation. The appendix has a normal appearance. There is no fracture or bone destruction. IMPRESSION: 1. No evidence of acute process. 2. Cholelithiasis. 3. Sigmoid diverticulosis. Please note that CT scanning at this site utilizes multiple dose reduction techniques, including automatic exposure control, adjustment of the MAA and/or KVP according to the patient's size, and use of iterative reconstruction. Electronically signed by: Buster Cash MD 07/11/2023 03:24 PM EDT RP Workstation: [id number]    DG Chest 2 View Result Date: 07/11/2023 CLINICAL DATA:  Chest pain and shortness of breath since Monday EXAM: CHEST - 2 VIEW COMPARISON:  10/14/2022 FINDINGS: Frontal and lateral views of the chest demonstrate a stable cardiac silhouette. Minimal left basilar scarring. No acute airspace disease, effusion, or pneumothorax. No acute bony  abnormality. IMPRESSION: 1. No acute intrathoracic process. Electronically Signed   By: Bobbye Burrow M.D.   On: 07/11/2023 14:43           Assessment/Plan Biliary pancreatitis/choledocholithiasis - CT 6/18 with cholelithiasis - RUQ US  6/18 with sludge and slightly enlarged CBD - MRCP 6/19 with cholelithiasis and choledocholithiasis - lipase >2800 on admit, AST/ALT 299/302, Tbili 1.6.  Lipase down to 139 today but LFTs remain stably elevated - no leukocytosis, HD stable, afebrile  - ERCP today with Dr. Willy Harvest -Tentatively plan for laparoscopic cholecystectomy tomorrow morning.  We have discussed the surgery including risks, benefits and alternatives with the patient and his wife as documented yesterday and their questions have been answered.  They wish to proceed.   FEN: N.p.o. for procedure, please keep n.p.o. after midnight tonight VTE:  SCDs ID: no current abx   - per [facility] -  PFO Hx of Paroxysmal atrial flutter   I reviewed hospitalist and consultant notes, last 24 h vitals and pain scores, last 48 h intake and output, last 24 h labs and trends, and last 24 h imaging results.   This care required moderate level of medical decision making.      LOS: 2 days    Adalberto Acton 07/13/2023

## 2023-07-14 ENCOUNTER — Inpatient Hospital Stay (HOSPITAL_COMMUNITY): Payer: Self-pay | Admitting: Anesthesiology

## 2023-07-14 ENCOUNTER — Encounter (HOSPITAL_COMMUNITY): Payer: Self-pay | Admitting: Internal Medicine

## 2023-07-14 ENCOUNTER — Encounter (HOSPITAL_COMMUNITY)
Admission: EM | Disposition: A | Discharge status: Home / Self Care | Payer: Self-pay | Source: Home / Self Care | Attending: Internal Medicine

## 2023-07-14 DIAGNOSIS — K851 Biliary acute pancreatitis without necrosis or infection: Secondary | ICD-10-CM | POA: Diagnosis not present

## 2023-07-14 HISTORY — PX: CHOLECYSTECTOMY: SHX55

## 2023-07-14 LAB — CBC
HCT: 40.4 % (ref 39.0–52.0)
Hemoglobin: 13.1 g/dL (ref 13.0–17.0)
MCH: 27.9 pg (ref 26.0–34.0)
MCHC: 32.4 g/dL (ref 30.0–36.0)
MCV: 86 fL (ref 80.0–100.0)
Platelets: 139 10*3/uL — ABNORMAL LOW (ref 150–400)
RBC: 4.7 MIL/uL (ref 4.22–5.81)
RDW: 13.8 % (ref 11.5–15.5)
WBC: 7.8 10*3/uL (ref 4.0–10.5)
nRBC: 0 % (ref 0.0–0.2)

## 2023-07-14 LAB — COMPREHENSIVE METABOLIC PANEL WITH GFR
ALT: 266 U/L — ABNORMAL HIGH (ref 0–44)
AST: 83 U/L — ABNORMAL HIGH (ref 15–41)
Albumin: 3.5 g/dL (ref 3.5–5.0)
Alkaline Phosphatase: 175 U/L — ABNORMAL HIGH (ref 38–126)
Anion gap: 11 (ref 5–15)
BUN: 11 mg/dL (ref 6–20)
CO2: 25 mmol/L (ref 22–32)
Calcium: 9.1 mg/dL (ref 8.9–10.3)
Chloride: 103 mmol/L (ref 98–111)
Creatinine, Ser: 1 mg/dL (ref 0.61–1.24)
GFR, Estimated: >60 mL/min (ref >60)
Glucose, Bld: 130 mg/dL — ABNORMAL HIGH (ref 70–99)
Potassium: 4.4 mmol/L (ref 3.5–5.1)
Sodium: 139 mmol/L (ref 135–145)
Total Bilirubin: 0.8 mg/dL (ref 0.0–1.2)
Total Protein: 6.8 g/dL (ref 6.5–8.1)

## 2023-07-14 LAB — LIPASE, BLOOD: Lipase: 33 U/L (ref 11–51)

## 2023-07-14 SURGERY — LAPAROSCOPIC CHOLECYSTECTOMY
Anesthesia: General

## 2023-07-14 MED ORDER — HYDROMORPHONE HCL 1 MG/ML IJ SOLN
0.2500 mg | INTRAMUSCULAR | Status: DC | PRN
  Administered 2023-07-14 (×2): 0.5 mg via INTRAVENOUS

## 2023-07-14 MED ORDER — MEPERIDINE HCL 50 MG/ML IJ SOLN: 6.2500 mg | INTRAMUSCULAR | Status: DC | PRN

## 2023-07-14 MED ORDER — DEXAMETHASONE SODIUM PHOSPHATE 10 MG/ML IJ SOLN
INTRAMUSCULAR | Status: DC | PRN
Start: 2023-07-14 — End: 2023-07-14
  Administered 2023-07-14: 8 mg via INTRAVENOUS

## 2023-07-14 MED ORDER — HYDROMORPHONE HCL 1 MG/ML IJ SOLN
INTRAMUSCULAR | Status: AC
  Filled 2023-07-14: qty 1

## 2023-07-14 MED ORDER — DEXAMETHASONE SODIUM PHOSPHATE 10 MG/ML IJ SOLN
INTRAMUSCULAR | Status: AC
  Filled 2023-07-14: qty 2

## 2023-07-14 MED ORDER — ROCURONIUM BROMIDE 10 MG/ML (PF) SYRINGE
PREFILLED_SYRINGE | INTRAVENOUS | Status: AC
  Filled 2023-07-14: qty 10

## 2023-07-14 MED ORDER — PHENYLEPHRINE 80 MCG/ML (10ML) SYRINGE FOR IV PUSH (FOR BLOOD PRESSURE SUPPORT)
PREFILLED_SYRINGE | INTRAVENOUS | Status: AC
  Filled 2023-07-14: qty 10

## 2023-07-14 MED ORDER — OXYCODONE HCL 5 MG PO TABS: 5.0000 mg | ORAL_TABLET | Freq: Four times a day (QID) | ORAL | 0 refills | Status: DC | PRN

## 2023-07-14 MED ORDER — OXYCODONE HCL 5 MG/5ML PO SOLN: 5.0000 mg | Freq: Once | ORAL | Status: DC | PRN

## 2023-07-14 MED ORDER — LIDOCAINE HCL (PF) 2 % IJ SOLN
INTRAMUSCULAR | Status: AC
  Filled 2023-07-14: qty 5

## 2023-07-14 MED ORDER — MIDAZOLAM HCL 2 MG/2ML IJ SOLN
INTRAMUSCULAR | Status: DC | PRN
  Administered 2023-07-14: 2 mg via INTRAVENOUS

## 2023-07-14 MED ORDER — FENTANYL CITRATE (PF) 100 MCG/2ML IJ SOLN
INTRAMUSCULAR | Status: AC
  Filled 2023-07-14: qty 2

## 2023-07-14 MED ORDER — ACETAMINOPHEN 10 MG/ML IV SOLN
INTRAVENOUS | Status: DC | PRN
Start: 2023-07-14 — End: 2023-07-14
  Administered 2023-07-14: 1000 mg via INTRAVENOUS

## 2023-07-14 MED ORDER — CEFAZOLIN SODIUM-DEXTROSE 2-4 GM/100ML-% IV SOLN
2.0000 g | Freq: Once | INTRAVENOUS | Status: AC
  Administered 2023-07-14: 2 g via INTRAVENOUS

## 2023-07-14 MED ORDER — CEFAZOLIN SODIUM-DEXTROSE 2-4 GM/100ML-% IV SOLN
INTRAVENOUS | Status: AC
  Filled 2023-07-14: qty 100

## 2023-07-14 MED ORDER — ONDANSETRON HCL 4 MG/2ML IJ SOLN
INTRAMUSCULAR | Status: DC | PRN
  Administered 2023-07-14: 4 mg via INTRAVENOUS

## 2023-07-14 MED ORDER — LACTATED RINGERS IV SOLN
INTRAVENOUS | Status: AC | PRN
  Administered 2023-07-14: 1

## 2023-07-14 MED ORDER — ONDANSETRON HCL 4 MG/2ML IJ SOLN
INTRAMUSCULAR | Status: AC
  Filled 2023-07-14: qty 2

## 2023-07-14 MED ORDER — LIDOCAINE HCL (PF) 2 % IJ SOLN
INTRAMUSCULAR | Status: DC | PRN
  Administered 2023-07-14: 100 mg via INTRADERMAL

## 2023-07-14 MED ORDER — FENTANYL CITRATE (PF) 100 MCG/2ML IJ SOLN
INTRAMUSCULAR | Status: DC | PRN
  Administered 2023-07-14: 100 ug via INTRAVENOUS
  Administered 2023-07-14 (×2): 50 ug via INTRAVENOUS

## 2023-07-14 MED ORDER — PROPOFOL 10 MG/ML IV BOLUS
INTRAVENOUS | Status: AC
  Filled 2023-07-14: qty 20

## 2023-07-14 MED ORDER — METOPROLOL TARTRATE 5 MG/5ML IV SOLN
INTRAVENOUS | Status: AC
Start: 2023-07-14 — End: 2023-07-14
  Filled 2023-07-14: qty 5

## 2023-07-14 MED ORDER — HYDROMORPHONE HCL 1 MG/ML IJ SOLN
0.2500 mg | INTRAMUSCULAR | Status: DC | PRN
  Administered 2023-07-14 (×4): 0.5 mg via INTRAVENOUS

## 2023-07-14 MED ORDER — FENTANYL CITRATE (PF) 100 MCG/2ML IJ SOLN
INTRAMUSCULAR | Status: AC
Start: 2023-07-14 — End: 2023-07-14
  Filled 2023-07-14: qty 2

## 2023-07-14 MED ORDER — MIDAZOLAM HCL 2 MG/2ML IJ SOLN
INTRAMUSCULAR | Status: AC
  Filled 2023-07-14: qty 2

## 2023-07-14 MED ORDER — BUPIVACAINE-EPINEPHRINE 0.25% -1:200000 IJ SOLN
INTRAMUSCULAR | Status: DC | PRN
Start: 2023-07-14 — End: 2023-07-14
  Administered 2023-07-14: 15 mL

## 2023-07-14 MED ORDER — SPY AGENT GREEN - (INDOCYANINE FOR INJECTION)
1.2500 mg | Freq: Once | INTRAMUSCULAR | Status: DC
  Administered 2023-07-14: 1.25 mg via INTRAVENOUS
  Filled 2023-07-14: qty 10

## 2023-07-14 MED ORDER — ACETAMINOPHEN 10 MG/ML IV SOLN
INTRAVENOUS | Status: AC
  Filled 2023-07-14: qty 100

## 2023-07-14 MED ORDER — SODIUM CHLORIDE 0.9 % IV SOLN: 12.5000 mg | INTRAVENOUS | Status: DC | PRN

## 2023-07-14 MED ORDER — PROPOFOL 10 MG/ML IV BOLUS
INTRAVENOUS | Status: DC | PRN
Start: 2023-07-14 — End: 2023-07-14
  Administered 2023-07-14: 150 mg via INTRAVENOUS

## 2023-07-14 MED ORDER — LACTATED RINGERS IV SOLN: INTRAVENOUS | Status: DC | PRN

## 2023-07-14 MED ORDER — SPY AGENT GREEN - (INDOCYANINE FOR INJECTION)
INTRAMUSCULAR | Status: DC | PRN
Start: 2023-07-14 — End: 2023-07-14
  Administered 2023-07-14: 5 mg via INTRAVENOUS

## 2023-07-14 MED ORDER — AMISULPRIDE (ANTIEMETIC) 5 MG/2ML IV SOLN
10.0000 mg | Freq: Once | INTRAVENOUS | Status: DC | PRN
Start: 2023-07-14 — End: 2023-07-14

## 2023-07-14 MED ORDER — ROCURONIUM BROMIDE 10 MG/ML (PF) SYRINGE
PREFILLED_SYRINGE | INTRAVENOUS | Status: DC | PRN
  Administered 2023-07-14: 60 mg via INTRAVENOUS

## 2023-07-14 MED ORDER — BUPIVACAINE-EPINEPHRINE (PF) 0.25% -1:200000 IJ SOLN
INTRAMUSCULAR | Status: AC
  Filled 2023-07-14: qty 30

## 2023-07-14 MED ORDER — HYDROMORPHONE HCL 1 MG/ML IJ SOLN
INTRAMUSCULAR | Status: AC
Start: 2023-07-14 — End: 2023-07-14
  Filled 2023-07-14: qty 1

## 2023-07-14 MED ORDER — INDOCYANINE GREEN 25 MG IV SOLR
1.2500 mg | Freq: Once | INTRAVENOUS | Status: DC
  Filled 2023-07-14: qty 10

## 2023-07-14 MED ORDER — SUGAMMADEX SODIUM 200 MG/2ML IV SOLN
INTRAVENOUS | Status: DC | PRN
  Administered 2023-07-14: 200 mg via INTRAVENOUS

## 2023-07-14 MED ORDER — OXYCODONE HCL 5 MG PO TABS: 5.0000 mg | ORAL_TABLET | Freq: Once | ORAL | Status: DC | PRN

## 2023-07-14 MED ORDER — CEFAZOLIN SODIUM-DEXTROSE 1-4 GM/50ML-% IV SOLN: 1.0000 g | Freq: Once | INTRAVENOUS | Status: DC

## 2023-07-14 SURGICAL SUPPLY — 36 items
BAG COUNTER SPONGE SURGICOUNT (BAG) IMPLANT
BENZOIN TINCTURE PRP APPL 2/3 (GAUZE/BANDAGES/DRESSINGS) IMPLANT
CABLE HIGH FREQUENCY MONO STRZ (ELECTRODE) ×1 IMPLANT
CHLORAPREP W/TINT 26 (MISCELLANEOUS) ×1 IMPLANT
CLIP APPLIE 5 13 M/L LIGAMAX5 (MISCELLANEOUS) ×1 IMPLANT
CLIP APPLIE ROT 10 11.4 M/L (STAPLE) IMPLANT
COVER MAYO STAND XLG (MISCELLANEOUS) ×1 IMPLANT
COVER SURGICAL LIGHT HANDLE (MISCELLANEOUS) ×1 IMPLANT
DERMABOND ADVANCED .7 DNX12 (GAUZE/BANDAGES/DRESSINGS) IMPLANT
DRAPE C-ARM 42X120 X-RAY (DRAPES) IMPLANT
ELECT REM PT RETURN 15FT ADLT (MISCELLANEOUS) ×1 IMPLANT
GLOVE BIO SURGEON STRL SZ7 (GLOVE) ×1 IMPLANT
GLOVE BIOGEL PI IND STRL 7.5 (GLOVE) ×1 IMPLANT
GOWN STRL REUS W/ TWL LRG LVL3 (GOWN DISPOSABLE) ×1 IMPLANT
GRASPER SUT TROCAR 14GX15 (MISCELLANEOUS) IMPLANT
HEMOSTAT SNOW SURGICEL 2X4 (HEMOSTASIS) IMPLANT
IRRIGATION SUCT STRKRFLW 2 WTP (MISCELLANEOUS) ×1 IMPLANT
KIT BASIN OR (CUSTOM PROCEDURE TRAY) ×1 IMPLANT
KIT TURNOVER KIT A (KITS) ×1 IMPLANT
PENCIL SMOKE EVACUATOR (MISCELLANEOUS) IMPLANT
POUCH RETRIEVAL ECOSAC 10 (ENDOMECHANICALS) ×1 IMPLANT
PROTECTOR NERVE ULNAR (MISCELLANEOUS) IMPLANT
SCISSORS LAP 5X35 DISP (ENDOMECHANICALS) ×1 IMPLANT
SET CHOLANGIOGRAPH MIX (MISCELLANEOUS) IMPLANT
SET TUBE SMOKE EVAC HIGH FLOW (TUBING) ×1 IMPLANT
SLEEVE Z-THREAD 5X100MM (TROCAR) ×2 IMPLANT
SPIKE FLUID TRANSFER (MISCELLANEOUS) ×1 IMPLANT
STRIP CLOSURE SKIN 1/2X4 (GAUZE/BANDAGES/DRESSINGS) IMPLANT
SUT MNCRL AB 4-0 PS2 18 (SUTURE) ×1 IMPLANT
SUT VICRYL 0 TIES 12 18 (SUTURE) IMPLANT
SUT VICRYL 0 UR6 27IN ABS (SUTURE) IMPLANT
TOWEL OR 17X26 10 PK STRL BLUE (TOWEL DISPOSABLE) ×1 IMPLANT
TRAY LAPAROSCOPIC (CUSTOM PROCEDURE TRAY) ×1 IMPLANT
TROCAR 11X100 Z THREAD (TROCAR) IMPLANT
TROCAR BALLN 12MMX100 BLUNT (TROCAR) ×1 IMPLANT
TROCAR Z-THREAD OPTICAL 5X100M (TROCAR) ×1 IMPLANT

## 2023-07-14 NOTE — Anesthesia Procedure Notes (Signed)
 Procedure Name: Intubation Date/Time: 07/14/2023 8:55 AM  Performed by: Obadiah Reyes BROCKS, CRNAPre-anesthesia Checklist: Patient identified, Emergency Drugs available, Suction available and Patient being monitored Patient Re-evaluated:Patient Re-evaluated prior to induction Oxygen Delivery Method: Circle System Utilized Preoxygenation: Pre-oxygenation with 100% oxygen Induction Type: IV induction Ventilation: Mask ventilation without difficulty Laryngoscope Size: Miller and 2 Grade View: Grade I Tube type: Oral Number of attempts: 1 Airway Equipment and Method: Stylet and Oral airway Placement Confirmation: ETT inserted through vocal cords under direct vision, positive ETCO2 and breath sounds checked- equal and bilateral Secured at: 22 cm Tube secured with: Tape Dental Injury: Teeth and Oropharynx as per pre-operative assessment

## 2023-07-14 NOTE — Progress Notes (Signed)
 1 Day Post-Op   Subjective/Chief Complaint: Doing fine after ercp   Objective: Vital signs in last 24 hours: Temp:  [97.5 F (36.4 C)-98.1 F (36.7 C)] 97.8 F (36.6 C) (06/21 0501) Pulse Rate:  [63-83] 63 (06/21 0501) Resp:  [10-18] 18 (06/21 0501) BP: (127-144)/(59-91) 127/70 (06/21 0501) SpO2:  [96 %-99 %] 96 % (06/21 0501) Weight:  [108.9 kg] 108.9 kg (06/20 1212) Last BM Date : 07/12/23  Intake/Output from previous day: 06/20 0701 - 06/21 0700 In: 2705.7 [P.O.:240; I.V.:2265.7; IV Piggyback:200] Out: -  Intake/Output this shift: No intake/output data recorded.  Ab soft nontender  Lab Results:  Recent Labs    07/13/23 0426 07/14/23 0515  WBC 6.5 7.8  HGB 13.9 13.1  HCT 42.9 40.4  PLT 148* 139*   BMET Recent Labs    07/13/23 0426 07/14/23 0515  NA 139 139  K 3.8 4.4  CL 104 103  CO2 26 25  GLUCOSE 123* 130*  BUN 11 11  CREATININE 1.09 1.00  CALCIUM 8.6* 9.1   PT/INR No results for input(s): LABPROT, INR in the last 72 hours. ABG No results for input(s): PHART, HCO3 in the last 72 hours.  Invalid input(s): PCO2, PO2  Studies/Results: MR ABDOMEN MRCP W WO CONTAST Result Date: 07/12/2023 CLINICAL DATA:  Acute pancreatitis with right upper quadrant abdominal pain EXAM: MRI ABDOMEN WITHOUT AND WITH CONTRAST (INCLUDING MRCP) TECHNIQUE: Multiplanar multisequence MR imaging of the abdomen was performed both before and after the administration of intravenous contrast. Heavily T2-weighted images of the biliary and pancreatic ducts were obtained. Post-processing was applied at the acquisition scanner with concurrent physician supervision which includes 3D reconstructions, MIPs, volume rendered images and/or shaded surface rendering. CONTRAST:  10mL GADAVIST  GADOBUTROL  1 MMOL/ML IV SOLN COMPARISON:  Abdominal ultrasound examination and CT abdomen and pelvis dated 07/11/2023 FINDINGS: Lower chest: No acute findings. Hepatobiliary: Hepatic steatosis. No  intrahepatic bile duct dilation. Common bile duct measures 6 mm in diameter with multiple filling defects within the downstream duct. Cholelithiasis. Pancreas: No mass, inflammatory changes, or other parenchymal abnormality identified. Spleen:  Within normal limits in size and appearance. Adrenals/Urinary Tract: No adrenal nodules. No suspicious renal masses identified. No evidence of hydronephrosis. Stomach/Bowel: Small duodenal diverticulum arising from the third portion of the duodenum. Normal appendix. Vascular/Lymphatic: No pathologically enlarged lymph nodes identified. No abdominal aortic aneurysm demonstrated. Left-sided infrarenal IVC. Other:  None. Musculoskeletal: No suspicious bone lesions identified. IMPRESSION: 1. Cholelithiasis and choledocholithiasis. No intrahepatic bile duct dilation. 2. Hepatic steatosis. 3. No MR evidence of acute pancreatitis. Electronically Signed   By: Limin  Xu M.D.   On: 07/12/2023 15:47   MR 3D Recon At Scanner Result Date: 07/12/2023 CLINICAL DATA:  Acute pancreatitis with right upper quadrant abdominal pain EXAM: MRI ABDOMEN WITHOUT AND WITH CONTRAST (INCLUDING MRCP) TECHNIQUE: Multiplanar multisequence MR imaging of the abdomen was performed both before and after the administration of intravenous contrast. Heavily T2-weighted images of the biliary and pancreatic ducts were obtained. Post-processing was applied at the acquisition scanner with concurrent physician supervision which includes 3D reconstructions, MIPs, volume rendered images and/or shaded surface rendering. CONTRAST:  10mL GADAVIST  GADOBUTROL  1 MMOL/ML IV SOLN COMPARISON:  Abdominal ultrasound examination and CT abdomen and pelvis dated 07/11/2023 FINDINGS: Lower chest: No acute findings. Hepatobiliary: Hepatic steatosis. No intrahepatic bile duct dilation. Common bile duct measures 6 mm in diameter with multiple filling defects within the downstream duct. Cholelithiasis. Pancreas: No mass, inflammatory  changes, or other parenchymal abnormality identified. Spleen:  Within  normal limits in size and appearance. Adrenals/Urinary Tract: No adrenal nodules. No suspicious renal masses identified. No evidence of hydronephrosis. Stomach/Bowel: Small duodenal diverticulum arising from the third portion of the duodenum. Normal appendix. Vascular/Lymphatic: No pathologically enlarged lymph nodes identified. No abdominal aortic aneurysm demonstrated. Left-sided infrarenal IVC. Other:  None. Musculoskeletal: No suspicious bone lesions identified. IMPRESSION: 1. Cholelithiasis and choledocholithiasis. No intrahepatic bile duct dilation. 2. Hepatic steatosis. 3. No MR evidence of acute pancreatitis. Electronically Signed   By: Limin  Xu M.D.   On: 07/12/2023 15:47    Anti-infectives: Anti-infectives (From admission, onward)    None       Assessment/Plan: Biliary pancreatitis/choledocholithiasis s/p ercp 6/20 - CT 6/18 with cholelithiasis - RUQ US  6/18 with sludge and slightly enlarged CBD - MRCP 6/19 with cholelithiasis and choledocholithiasis -lap chole today -I discussed the procedure in detail.  We discussed the risks and benefits of a laparoscopic cholecystectomy and possible cholangiogram including, but not limited to bleeding, infection, injury to surrounding structures such as the intestine or liver, bile leak, retained gallstones, need to convert to an open procedure, prolonged diarrhea, blood clots such as  DVT, common bile duct injury, anesthesia risks, and possible need for additional procedures.  The likelihood of improvement in symptoms and return to the patient's normal status is good. We discussed the typical post-operative recovery course.    FEN: NPO VTE: SCDs ID: no current abx   - per [facility] -  PFO Hx of Paroxysmal atrial flutter  I reviewed hospitalist notes, last 24 h vitals and pain scores, last 48 h intake and output, last 24 h labs and trends, and last 24 h imaging  results.   Jon Hurst 07/14/2023

## 2023-07-14 NOTE — Progress Notes (Signed)
  Progress Note   Patient: Jon Hurst FMW:[id number] DOB: 1970/10/03 DOA: 07/11/2023     3 DOS: the patient was seen and examined on 07/14/2023   Brief hospital course: TYREESE THAIN is a 53 y.o. male with medical history significant for isolated episode of atrial flutter in 2018 not on anticoagulation, HLD who is admitted with acute gallstone pancreatitis. MRCP demonstrated cholecystitis and cholellithiasis. GI has been consulted and will take the patient for ERCP today. General surgery has also been consulted for cholecystitis following ERCP. ERCP demonstrated stones blocking bile duct and stones behind the blockage as well as stones in the gallbladder. A sphincterotomy was performed and the duct was swept for stones.   On 07/14/2023 the patient underwent laparoscopic cholecystectomy. He has tolerated the procedure well. He is on a clear liquid diet as per surgery's instructions.  Assessment and Plan: Acute choledocholithiasis/Acute gallstone pancreatitis The patient underwent ERCP on 07/13/2023 which demonstrated stones blocking bile duct and stones behind the blockage as well as stones in the gallbladder. A sphincterotomy was performed and the duct was swept for stones.   On the morning of 07/14/2023 the patient underwent laparoscopic cholecystectomy. He has tolerated the procedure well. He is on a clear liquid diet as per surgery's instructions.  Hyperlipidemia: Holding statin.   History of isolated atrial flutter: Follows with cardiology.  Per their documentation patient had isolated atrial flutter in 2018 without recurrence.  Not on chronic anticoagulation.   Diarrhea None since admission.  Subjective: The patient is resting comfortably. No new complaints.   Physical Exam: Vitals:   07/14/23 1100 07/14/23 1103 07/14/23 1115 07/14/23 1125  BP: (!) 147/92  121/72 136/89  Pulse: 71 79 70 73  Resp: 11 10 12 18   Temp:    97.8 F (36.6 C)  TempSrc:      SpO2: 95% 93% 92% 96%   Weight:      Height:       Exam:  Constitutional:  The patient is awake, alert, and oriented x 3. No acute distress. Eyes:  pupils and irises appear normal Normal lids and conjunctivae ENMT:  grossly normal hearing  Lips appear normal external ears, nose appear normal Oropharynx: mucosa, tongue,posterior pharynx appear normal Neck:  neck appears normal, no masses, normal ROM, supple no thyromegaly Respiratory:  No increased work of breathing. No wheezes, rales, or rhonchi No tactile fremitus Cardiovascular:  Regular rate and rhythm No murmurs, ectopy, or gallups. No lateral PMI. No thrills. Abdomen:  Abdomen is slightly distended and sore.  Hypoactive bowel sounds. No hernias, masses, or organomegaly Musculoskeletal:  No cyanosis, clubbing, or edema Skin:  No rashes, lesions, ulcers palpation of skin: no induration or nodules Neurologic:  CN 2-12 intact Sensation all 4 extremities intact Psychiatric:  Mental status Mood, affect appropriate Orientation to person, place, time  judgment and insight appear intact  Data Reviewed:  CT abdomen and pelvis MRCP CBC CMP  Family Communication: Spouse was at bedside. All questions answered to the best of my ability.  Disposition: Status is: Inpatient Remains inpatient appropriate because: Need for ERCP and Cholecystectomy for acute gallstone pancreatitis due to cholecodocholithiasis.  Planned Discharge Destination: Home    Time spent: 30 minutes  Author: Tayonna Bacha, DO 07/14/2023 5:52 PM  For on call review [email or web address].

## 2023-07-14 NOTE — Plan of Care (Signed)
  Problem: Clinical Measurements: Goal: Will remain free from infection Outcome: Progressing   Problem: Pain Managment: Goal: General experience of comfort will improve and/or be controlled Outcome: Progressing

## 2023-07-14 NOTE — Addendum Note (Signed)
 Addendum  created 07/14/23 1149 by Obadiah Reyes BROCKS, CRNA   Intraprocedure Meds edited

## 2023-07-14 NOTE — Op Note (Signed)
 Preoperative diagnosis: choledocholithiasis, GSP Postoperative diagnosis: Same as above, chronic cholecystitis Procedure: Laparoscopic cholecystectomy Surgeon: Dr. Adina Bury Estimated blood loss: Less than 50 cc Anesthesia: General Complications: None Drains: None Specimens: Gallbladder and contents to pathology Sponge needle count was correct at completion Disposition to recovery in stable condition   Indications: 31 yom with choledocholithiasis s/p ercp yesterday doing well. Plan for lap chole today   Procedure: After informed consent was obtained he was taken to the operating room.  He was given antibiotics.  SCDs were in place.  He was given indocyanine green  dye.  He was placed under general anesthesia without complication.  He was prepped and draped in standard sterile surgical fashion.  A surgical timeout was then performed.   I made a incision below his umbilicus after infiltrating local anesthetic.  I carried this to the fascia.  The fascia was entered sharply.  The peritoneum was entered bluntly without injury.  I then placed a 0 Vicryl pursestring suture through the fascia and inserted a Hassan trocar.  The abdomen was insufflated to 15 mmHg pressure.  I then inserted 3 additional 5 mm trocars in epigastrium and right upper quadrant under direct vision without complication.  His gallbladder was noted to have chronic cholecystitis. I had to use sharp dissection to cut away the duodenum from his galbladder. I also had to place an additional 5 mm trocar just to retract omentum so I could see in the triangle.   I retracted it cephalad and lateral.  I was able to dissect out the critical view of safety in the triangle.  I clearly identified the cystic duct and cystic artery.  I used the green dye to identify the cystic duct and a portion of the common bile duct.  I then took a good portion of the gallbladder off the cystic plate to ensure this was the correct view.  I then clipped the duct  4 times.  The clips completely traverse the duct and the duct was viable.  I then divided the duct leaving 3 clips in place.  I treated the artery in a similar fashion.  The gallbladder was then removed from the liver bed.  This was somewhat difficult due to this chronic inflammation.  The gallbladder was then replaced in a retrieval bag and removed from the abdomen.  I then obtained hemostasis in the gallbladder bed.   I then removed the [facility] trocar and tied my pursestring down.  Using the suture passer I then placed an additional 0 Vicryl suture to completely obliterate that defect.  The remaining trocars were removed and the abdomen desufflated.  These were then closed with 4 Monocryl and glue.  He tolerated his well was extubated transferred to recovery stable.

## 2023-07-14 NOTE — Discharge Instructions (Signed)

## 2023-07-14 NOTE — Progress Notes (Signed)
 Pt was taken down to PACU at 0745 for Lap chole. Pt returned at 1125. Pt has x5 lap site derma bonded. Site assessed no redness or drainage noted. Wife at bedsidse.

## 2023-07-14 NOTE — Anesthesia Preprocedure Evaluation (Signed)
 Anesthesia Evaluation  Patient identified by MRN, date of birth, ID band Patient awake    Reviewed: Allergy & Precautions, H&P , NPO status , Patient's Chart, lab work & pertinent test results  Airway Mallampati: II  TM Distance: >3 FB Neck ROM: Full    Dental no notable dental hx. (+) Teeth Intact, Dental Advisory Given   Pulmonary former smoker   Pulmonary exam normal breath sounds clear to auscultation       Cardiovascular + dysrhythmias Atrial Fibrillation  Rhythm:Regular Rate:Normal  + PFO   Neuro/Psych negative neurological ROS  negative psych ROS   GI/Hepatic negative GI ROS, Neg liver ROS,,,  Endo/Other  negative endocrine ROS    Renal/GU negative Renal ROS  negative genitourinary   Musculoskeletal   Abdominal  (+) + obese  Peds  Hematology negative hematology ROS (+)   Anesthesia Other Findings   Reproductive/Obstetrics negative OB ROS                             Anesthesia Physical Anesthesia Plan  ASA: 2  Anesthesia Plan: General   Post-op Pain Management: Minimal or no pain anticipated   Induction: Intravenous  PONV Risk Score and Plan: 2 and Ondansetron , Midazolam  and Treatment may vary due to age or medical condition  Airway Management Planned: Oral ETT  Additional Equipment:   Intra-op Plan:   Post-operative Plan: Extubation in OR  Informed Consent: I have reviewed the patients History and Physical, chart, labs and discussed the procedure including the risks, benefits and alternatives for the proposed anesthesia with the patient or authorized representative who has indicated his/her understanding and acceptance.     Dental advisory given  Plan Discussed with: CRNA  Anesthesia Plan Comments:        Anesthesia Quick Evaluation

## 2023-07-14 NOTE — Transfer of Care (Signed)
 Immediate Anesthesia Transfer of Care Note  Patient: Jon Hurst  Procedure(s) Performed: LAPAROSCOPIC CHOLECYSTECTOMY  Patient Location: PACU  Anesthesia Type:General  Level of Consciousness: awake, alert , and oriented  Airway & Oxygen Therapy: Patient Spontanous Breathing and Patient connected to nasal cannula oxygen  Post-op Assessment: Report given to RN and Post -op Vital signs reviewed and stable  Post vital signs: Reviewed and stable  Last Vitals:  Vitals Value Taken Time  BP 158/99   Temp 36.9   Pulse 78   Resp 20   SpO2 100     Last Pain:  Vitals:   07/14/23 0501  TempSrc: Oral  PainSc:          Complications: No notable events documented.

## 2023-07-14 NOTE — Plan of Care (Signed)

## 2023-07-14 NOTE — Anesthesia Postprocedure Evaluation (Signed)
 Anesthesia Post Note  Patient: Jon Hurst  Procedure(s) Performed: LAPAROSCOPIC CHOLECYSTECTOMY     Patient location during evaluation: PACU Anesthesia Type: General Level of consciousness: awake and alert Pain management: pain level controlled Vital Signs Assessment: post-procedure vital signs reviewed and stable Respiratory status: spontaneous breathing, nonlabored ventilation and respiratory function stable Cardiovascular status: blood pressure returned to baseline and stable Postop Assessment: no apparent nausea or vomiting Anesthetic complications: no   No notable events documented.  Last Vitals:  Vitals:   07/14/23 1115 07/14/23 1125  BP: 121/72 136/89  Pulse:  73  Resp: 12 18  Temp:  36.6 C  SpO2:  96%    Last Pain:  Vitals:   07/14/23 1100  TempSrc:   PainSc: 6                  Butler Levander Pinal

## 2023-07-15 ENCOUNTER — Encounter (HOSPITAL_COMMUNITY): Payer: Self-pay | Admitting: General Surgery

## 2023-07-15 DIAGNOSIS — K851 Biliary acute pancreatitis without necrosis or infection: Secondary | ICD-10-CM | POA: Diagnosis not present

## 2023-07-15 DIAGNOSIS — K8051 Calculus of bile duct without cholangitis or cholecystitis with obstruction: Secondary | ICD-10-CM | POA: Diagnosis not present

## 2023-07-15 DIAGNOSIS — R7989 Other specified abnormal findings of blood chemistry: Secondary | ICD-10-CM

## 2023-07-15 LAB — CBC WITH DIFFERENTIAL/PLATELET
Abs Immature Granulocytes: 0.07 10*3/uL (ref 0.00–0.07)
Basophils Absolute: 0 10*3/uL (ref 0.0–0.1)
Basophils Relative: 0 %
Eosinophils Absolute: 0 10*3/uL (ref 0.0–0.5)
Eosinophils Relative: 0 %
HCT: 42.9 % (ref 39.0–52.0)
Hemoglobin: 13.7 g/dL (ref 13.0–17.0)
Immature Granulocytes: 1 %
Lymphocytes Relative: 16 %
Lymphs Abs: 2 10*3/uL (ref 0.7–4.0)
MCH: 28.2 pg (ref 26.0–34.0)
MCHC: 31.9 g/dL (ref 30.0–36.0)
MCV: 88.5 fL (ref 80.0–100.0)
Monocytes Absolute: 0.7 10*3/uL (ref 0.1–1.0)
Monocytes Relative: 6 %
Neutro Abs: 9.8 10*3/uL — ABNORMAL HIGH (ref 1.7–7.7)
Neutrophils Relative %: 77 %
Platelets: 166 10*3/uL (ref 150–400)
RBC: 4.85 MIL/uL (ref 4.22–5.81)
RDW: 14.3 % (ref 11.5–15.5)
WBC: 12.6 10*3/uL — ABNORMAL HIGH (ref 4.0–10.5)
nRBC: 0 % (ref 0.0–0.2)

## 2023-07-15 LAB — COMPREHENSIVE METABOLIC PANEL WITH GFR
ALT: 194 U/L — ABNORMAL HIGH (ref 0–44)
AST: 49 U/L — ABNORMAL HIGH (ref 15–41)
Albumin: 3.8 g/dL (ref 3.5–5.0)
Alkaline Phosphatase: 148 U/L — ABNORMAL HIGH (ref 38–126)
Anion gap: 11 (ref 5–15)
BUN: 13 mg/dL (ref 6–20)
CO2: 26 mmol/L (ref 22–32)
Calcium: 9.3 mg/dL (ref 8.9–10.3)
Chloride: 102 mmol/L (ref 98–111)
Creatinine, Ser: 1.06 mg/dL (ref 0.61–1.24)
GFR, Estimated: >60 mL/min (ref >60)
Glucose, Bld: 109 mg/dL — ABNORMAL HIGH (ref 70–99)
Potassium: 4.2 mmol/L (ref 3.5–5.1)
Sodium: 139 mmol/L (ref 135–145)
Total Bilirubin: 1.1 mg/dL (ref 0.0–1.2)
Total Protein: 7.3 g/dL (ref 6.5–8.1)

## 2023-07-15 MED ORDER — OXYCODONE HCL 5 MG PO TABS: 5.0000 mg | ORAL_TABLET | Freq: Four times a day (QID) | ORAL | 0 refills | Status: AC | PRN

## 2023-07-15 NOTE — Discharge Summary (Signed)
 Physician Discharge Summary   Patient: Jon Hurst MRN: [id number] DOB: March 09, 1970  Admit date:     07/11/2023  Discharge date: 07/15/2023  Discharge Physician: Brigida Bureau   PCP: Avva, Ravisankar, MD   Recommendations at discharge:    Discharge to home Follow up with PCP in 7-10 days. Follow up with General Surgery as directed  Discharge Diagnoses: Principal Problem:   Gallstone pancreatitis Active Problems:   Hypercholesterolemia   History of atrial flutter   Choledocholithiasis  Resolved Problems:   * No resolved hospital problems. *  Hospital Course: Jon Hurst is a 53 y.o. male with medical history significant for isolated episode of atrial flutter in 2018 not on anticoagulation, HLD who is admitted with acute gallstone pancreatitis. Gastroenterology and general surgery have been consulted. The patient underwent ERCP on 07/13/2023. The patient was found to have obstruction of bile ducts. He underwent a sphincterotomy and evacuation of stones from the duct. On 07/14/2023 the patient underwent laparoscopic cholecystectomy. He was placed on a clear liquid diet. He will be discharged to home today in fair condition. He is released to return to work on 07/20/2023. Assessment and Plan: Acute choledocholithiasis/Acute gallstone pancreatitis The patient underwent ERCP on 07/13/2023 which demonstrated stones blocking bile duct and stones behind the blockage as well as stones in the gallbladder. A sphincterotomy was performed and the duct was swept for stones.    On the morning of 07/14/2023 the patient underwent laparoscopic cholecystectomy. He has tolerated the procedure well. His diet has been advanced to a full liquid diet. He is tolerating it well. His pain is well controlled.    Hyperlipidemia: Holding statin.   History of isolated atrial flutter: Follows with cardiology.  Per their documentation patient had isolated atrial flutter in 2018 without recurrence.  Not on  chronic anticoagulation.   Diarrhea None since admission.        Consultants: General Surgery, Gastroenterology Procedures performed: ERCP, Laparoscopic Cholecystectomy  Disposition: Home Diet recommendation:  Discharge Diet Orders (From admission, onward)     Start     Ordered   07/15/23 0000  Diet - low sodium heart healthy        07/15/23 0946           Cardiac diet DISCHARGE MEDICATION: Allergies as of 07/15/2023   No Known Allergies      Medication List     STOP taking these medications    BEET ROOT PO   rosuvastatin 10 MG tablet Commonly known as: CRESTOR       TAKE these medications    oxyCODONE 5 MG immediate release tablet Commonly known as: Oxy IR/ROXICODONE Take 1 tablet (5 mg total) by mouth every 6 (six) hours as needed for moderate pain (pain score 4-6), severe pain (pain score 7-10) or breakthrough pain.   pantoprazole  40 MG tablet Commonly known as: PROTONIX  Take 1 tablet (40 mg total) by mouth daily.        Follow-up Information     [facility] Follow up.   Specialty: General Surgery Why: we will call with appt Contact information: [address] [place]  [address] [phone]               Discharge Exam: Fredricka Weights   07/11/23 1135 07/13/23 1212  Weight: 108.9 kg 108.9 kg   Exam:  Constitutional:  The patient is awake, alert, and oriented x 3. No acute distress. Eyes:  pupils and irises appear normal Normal  lids and conjunctivae ENMT:  grossly normal hearing  Lips appear normal external ears, nose appear normal Oropharynx: mucosa, tongue,posterior pharynx appear normal Neck:  neck appears normal, no masses, normal ROM, supple no thyromegaly Respiratory:  No increased work of breathing. No wheezes, rales, or rhonchi No tactile fremitus Cardiovascular:  Regular rate and rhythm No murmurs, ectopy, or gallups. No lateral PMI. No thrills. Abdomen:   Abdomen is soft, non-tender, non-distended No hernias, masses, or organomegaly Normoactive bowel sounds.  Musculoskeletal:  No cyanosis, clubbing, or edema Skin:  No rashes, lesions, ulcers palpation of skin: no induration or nodules Neurologic:  CN 2-12 intact Sensation all 4 extremities intact Psychiatric:  Mental status Mood, affect appropriate Orientation to person, place, time  judgment and insight appear intact   Condition at discharge: good  The results of significant diagnostics from this hospitalization (including imaging, microbiology, ancillary and laboratory) are listed below for reference.   Imaging Studies: MR ABDOMEN MRCP W WO CONTAST Result Date: 07/12/2023 CLINICAL DATA:  Acute pancreatitis with right upper quadrant abdominal pain EXAM: MRI ABDOMEN WITHOUT AND WITH CONTRAST (INCLUDING MRCP) TECHNIQUE: Multiplanar multisequence MR imaging of the abdomen was performed both before and after the administration of intravenous contrast. Heavily T2-weighted images of the biliary and pancreatic ducts were obtained. Post-processing was applied at the acquisition scanner with concurrent physician supervision which includes 3D reconstructions, MIPs, volume rendered images and/or shaded surface rendering. CONTRAST:  10mL GADAVIST  GADOBUTROL  1 MMOL/ML IV SOLN COMPARISON:  Abdominal ultrasound examination and CT abdomen and pelvis dated 07/11/2023 FINDINGS: Lower chest: No acute findings. Hepatobiliary: Hepatic steatosis. No intrahepatic bile duct dilation. Common bile duct measures 6 mm in diameter with multiple filling defects within the downstream duct. Cholelithiasis. Pancreas: No mass, inflammatory changes, or other parenchymal abnormality identified. Spleen:  Within normal limits in size and appearance. Adrenals/Urinary Tract: No adrenal nodules. No suspicious renal masses identified. No evidence of hydronephrosis. Stomach/Bowel: Small duodenal diverticulum arising from the third  portion of the duodenum. Normal appendix. Vascular/Lymphatic: No pathologically enlarged lymph nodes identified. No abdominal aortic aneurysm demonstrated. Left-sided infrarenal IVC. Other:  None. Musculoskeletal: No suspicious bone lesions identified. IMPRESSION: 1. Cholelithiasis and choledocholithiasis. No intrahepatic bile duct dilation. 2. Hepatic steatosis. 3. No MR evidence of acute pancreatitis. Electronically Signed   By: Limin  Xu M.D.   On: 07/12/2023 15:47   MR 3D Recon At Scanner Result Date: 07/12/2023 CLINICAL DATA:  Acute pancreatitis with right upper quadrant abdominal pain EXAM: MRI ABDOMEN WITHOUT AND WITH CONTRAST (INCLUDING MRCP) TECHNIQUE: Multiplanar multisequence MR imaging of the abdomen was performed both before and after the administration of intravenous contrast. Heavily T2-weighted images of the biliary and pancreatic ducts were obtained. Post-processing was applied at the acquisition scanner with concurrent physician supervision which includes 3D reconstructions, MIPs, volume rendered images and/or shaded surface rendering. CONTRAST:  10mL GADAVIST  GADOBUTROL  1 MMOL/ML IV SOLN COMPARISON:  Abdominal ultrasound examination and CT abdomen and pelvis dated 07/11/2023 FINDINGS: Lower chest: No acute findings. Hepatobiliary: Hepatic steatosis. No intrahepatic bile duct dilation. Common bile duct measures 6 mm in diameter with multiple filling defects within the downstream duct. Cholelithiasis. Pancreas: No mass, inflammatory changes, or other parenchymal abnormality identified. Spleen:  Within normal limits in size and appearance. Adrenals/Urinary Tract: No adrenal nodules. No suspicious renal masses identified. No evidence of hydronephrosis. Stomach/Bowel: Small duodenal diverticulum arising from the third portion of the duodenum. Normal appendix. Vascular/Lymphatic: No pathologically enlarged lymph nodes identified. No abdominal aortic aneurysm demonstrated. Left-sided infrarenal IVC.  Other:  None. Musculoskeletal: No suspicious bone lesions identified. IMPRESSION: 1. Cholelithiasis and choledocholithiasis. No intrahepatic bile duct dilation. 2. Hepatic steatosis. 3. No MR evidence of acute pancreatitis. Electronically Signed   By: Limin  Xu M.D.   On: 07/12/2023 15:47   US  Abdomen Limited RUQ (LIVER/GB) Result Date: 07/11/2023 CLINICAL DATA:  Epigastric pain for a week. Elevated lipase and amylase EXAM: ULTRASOUND ABDOMEN LIMITED RIGHT UPPER QUADRANT COMPARISON:  CT 07/11/2023 FINDINGS: Gallbladder: Distended gallbladder. Small amount of dependent sludge. No shadowing stones. No wall thickening or adjacent fluid. No reported sonographic Murphy's sign. Common bile duct: Diameter: 8 mm, slightly enlarged. Liver: Diffusely echogenic hepatic parenchyma consistent with fatty liver infiltration. Portal vein is patent on color Doppler imaging with normal direction of blood flow towards the liver. Other: None. IMPRESSION: Dilated gallbladder with slight ectasia of the common duct. There is some sludge identified. On prior CT scan there is a question of some dependent stones. These are not well seen on the current examination. However with the appearance overall would recommend further workup such as MRCP when clinically appropriate. Fatty liver infiltration. Electronically Signed   By: Ranell Bring M.D.   On: 07/11/2023 16:46   CT ABDOMEN PELVIS W CONTRAST Result Date: 07/11/2023 EXAM:  CT ABDOMEN PELVIS WITH IV CONTRAST INDICATION:  Abdominal pain, acute, nonlocalized TECHNIQUE: Spiral CT scanning was performed through the abdomen and pelvis after the patient received IV contrast. COMPARISON: 10/15/2022 FINDINGS: There is no significant abnormality identified in the lung bases, spleen, and pancreas. Hepatic steatosis is present. Cholelithiasis is present. No renal calculus, obstruction, or soft tissue mass is present. There are no adrenal masses. The abdominal bowel loops are unremarkable. There  is no evidence of ascites or adenopathy. No abdominal aortic aneurysm is present. 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 The bladder, prostate and seminal vesicles have a normal appearance. There is no inguinal hernia. There is mild sigmoid diverticulosis with no associated inflammation. The appendix has a normal appearance. There is no fracture or bone destruction. IMPRESSION: 1. No evidence of acute process. 2. Cholelithiasis. 3. Sigmoid diverticulosis. Please note that CT scanning at this site utilizes multiple dose reduction techniques, including automatic exposure control, adjustment of the MAA and/or KVP according to the patient's size, and use of iterative reconstruction. Electronically signed by: Eddy Oar MD 07/11/2023 03:24 PM EDT RP Workstation: [id number]   DG Chest 2 View Result Date: 07/11/2023 CLINICAL DATA:  Chest pain and shortness of breath since Monday EXAM: CHEST - 2 VIEW COMPARISON:  10/14/2022 FINDINGS: Frontal and lateral views of the chest demonstrate a stable cardiac silhouette. Minimal left basilar scarring. No acute airspace disease, effusion, or pneumothorax. No acute bony abnormality. IMPRESSION: 1. No acute intrathoracic process. Electronically Signed   By: Ozell Daring M.D.   On: 07/11/2023 14:43    Microbiology: Results for orders placed or performed in visit on 12/10/18  SARS Coronavirus 2 (TAT 6-24 hrs)     Status: None   Collection Time: 12/10/18 12:00 AM  Result Value Ref Range Status   SARS Coronavirus 2 RESULT:  NEGATIVE  Final    Comment: RESULT:  NEGATIVESARS-CoV-2 INTERPRETATION:A NEGATIVE  test result means that SARS-CoV-2 RNA was not present in the specimen above the limit of detection of this test. This does not preclude a possible SARS-CoV-2 infection and should not be used as the  sole basis for patient management decisions. Negative results must be combined with clinical observations, patient history, and epidemiological information. Optimum  specimen types and  timing for peak viral levels during infections caused by SARS-CoV-2  have not been determined. Collection of multiple specimens or types of specimens may be necessary to detect virus. Improper specimen collection and handling, sequence variability under primers/probes, or organism present below the limit of detection may  lead to false negative results. Positive and negative predictive values of testing are highly dependent on prevalence. False negative test results are more likely when prevalence of disease is high.The expected result is NEGATIVE.Fact  Sheet for  Healthcare Providers: quierodirigir.com.Fact Sheet for Patients: HairSlick.no.Normal Reference Range - Negative     Labs: CBC: Recent Labs  Lab 07/11/23 1207 07/12/23 0431 07/13/23 0426 07/14/23 0515 07/15/23 0521  WBC 10.0 9.3 6.5 7.8 12.6*  NEUTROABS  --   --   --   --  9.8*  HGB 15.0 14.6 13.9 13.1 13.7  HCT 43.8 44.6 42.9 40.4 42.9  MCV 81.3 85.9 87.4 86.0 88.5  PLT 185 175 148* 139* 166   Basic Metabolic Panel: Recent Labs  Lab 07/11/23 1207 07/12/23 0431 07/13/23 0426 07/14/23 0515 07/15/23 0521  NA 139 136 139 139 139  K 3.8 3.8 3.8 4.4 4.2  CL 103 100 104 103 102  CO2 25 24 26 25 26   GLUCOSE 129* 136* 123* 130* 109*  BUN 10 13 11 11 13   CREATININE 1.01 1.07 1.09 1.00 1.06  CALCIUM 9.7 9.1 8.6* 9.1 9.3   Liver Function Tests: Recent Labs  Lab 07/11/23 1213 07/12/23 0431 07/13/23 0426 07/14/23 0515 07/15/23 0521  AST 299* 258* 204* 83* 49*  ALT 302* 463* 356* 266* 194*  ALKPHOS 187* 166* 179* 175* 148*  BILITOT 1.6* 1.4* 1.6* 0.8 1.1  PROT 7.3 7.2 6.7 6.8 7.3  ALBUMIN 4.3 3.9 3.5 3.5 3.8   CBG: No results for input(s): GLUCAP in the last 168 hours.  Discharge time spent: greater than 30 minutes.  Signed: Brigida Bureau, DO [facility] 07/15/2023

## 2023-07-15 NOTE — Plan of Care (Signed)

## 2023-07-15 NOTE — Plan of Care (Signed)

## 2023-07-15 NOTE — Progress Notes (Signed)
   Patient had successful lap chole yesterday and is for discharge. Spoke to patient - feeling well. Was ambulating in hall. He has had no problems after ERCP for choledocholithiasis.  Does not need outpatient GI visit - can follow-up [facility] and PCP and see GI if there is a need. He will need f/u LFTs (these are declining) to normal.  Lupita CHARLENA Commander, MD, [facility] See TRACEY on call - gastroenterology for best contact person 07/15/2023 9:29 AM

## 2023-07-15 NOTE — Progress Notes (Signed)
 1 Day Post-Op   Subjective/Chief Complaint: Doing well   Objective: Vital signs in last 24 hours: Temp:  [97.7 F (36.5 C)-98.2 F (36.8 C)] 98.2 F (36.8 C) (06/22 0459) Pulse Rate:  [67-85] 69 (06/22 0459) Resp:  [10-18] 18 (06/22 0459) BP: (118-161)/(65-101) 118/65 (06/22 0459) SpO2:  [92 %-100 %] 100 % (06/22 0459) Last BM Date : 07/12/23  Intake/Output from previous day: 06/21 0701 - 06/22 0700 In: 480 [P.O.:480] Out: -  Intake/Output this shift: No intake/output data recorded.  Ab soft approp tender incisions clean  Lab Results:  Recent Labs    07/14/23 0515 07/15/23 0521  WBC 7.8 12.6*  HGB 13.1 13.7  HCT 40.4 42.9  PLT 139* 166   BMET Recent Labs    07/14/23 0515 07/15/23 0521  NA 139 139  K 4.4 4.2  CL 103 102  CO2 25 26  GLUCOSE 130* 109*  BUN 11 13  CREATININE 1.00 1.06  CALCIUM 9.1 9.3   PT/INR No results for input(s): LABPROT, INR in the last 72 hours. ABG No results for input(s): PHART, HCO3 in the last 72 hours.  Invalid input(s): PCO2, PO2  Studies/Results: No results found.  Anti-infectives: Anti-infectives (From admission, onward)    Start     Dose/Rate Route Frequency Ordered Stop   07/14/23 0815  ceFAZolin  (ANCEF ) IVPB 2g/100 mL premix        2 g 200 mL/hr over 30 Minutes Intravenous  Once 07/14/23 0729 07/14/23 0857   07/14/23 0815  ceFAZolin  (ANCEF ) IVPB 1 g/50 mL premix        1 g 100 mL/hr over 30 Minutes Intravenous  Once 07/14/23 9276         Assessment/Plan: POD 1 lap chole -can dc home today -lfts fine Donnice Bury 07/15/2023

## 2023-07-17 LAB — SURGICAL PATHOLOGY

## 2023-07-20 DIAGNOSIS — K851 Biliary acute pancreatitis without necrosis or infection: Secondary | ICD-10-CM | POA: Diagnosis not present

## 2023-08-24 DIAGNOSIS — R7989 Other specified abnormal findings of blood chemistry: Secondary | ICD-10-CM | POA: Diagnosis not present

## 2023-12-24 DIAGNOSIS — R82998 Other abnormal findings in urine: Secondary | ICD-10-CM | POA: Diagnosis not present
